# Patient Record
Sex: Male | Born: 1945 | Marital: Single | Smoking: Current every day smoker
Health system: Southern US, Community
[De-identification: ages and names within clinical notes are randomized; demographics above are authoritative.]

## PROBLEM LIST (undated history)

## (undated) DIAGNOSIS — I639 Cerebral infarction, unspecified: Secondary | ICD-10-CM

---

## 2009-11-22 ENCOUNTER — Inpatient Hospital Stay: Payer: Self-pay | Admitting: Internal Medicine

## 2012-10-30 IMAGING — CT CT HEAD WITHOUT CONTRAST
1 series · 16 of 30 positions shown, 20 images · non-contrast
Comparison: none

REASON FOR EXAM: syncope head injury
COMMENTS:

PROCEDURE:     CT  - CT HEAD WITHOUT CONTRAST  - November 22, 2009  [DATE]
RESULT:     Comparison:  None
TECHNIQUE: Multiple axial images from the foramen magnum to the vertex were
obtained without IV contrast.

[Series 2: soft tissue · axial · 0.46mm/px · z∈[+156,+296]mm · 16 of 32 slices shown, 20 images]
[im 2/32  brain]
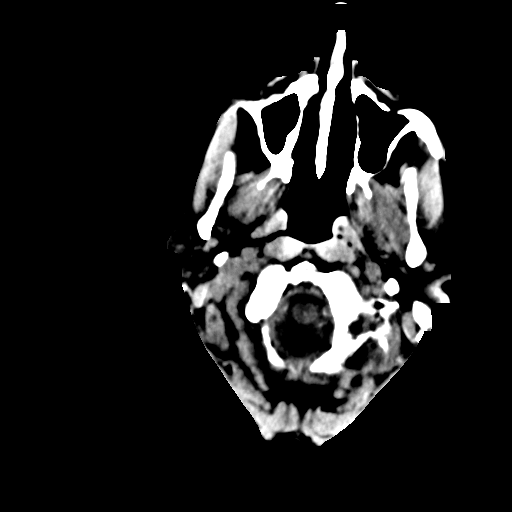
[im 2/32  bone]
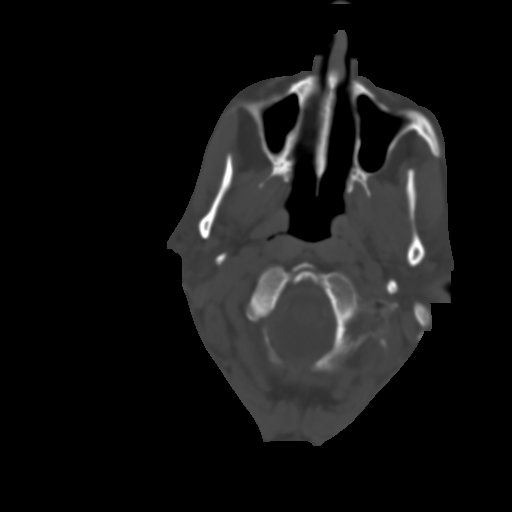
[im 4/32  brain]
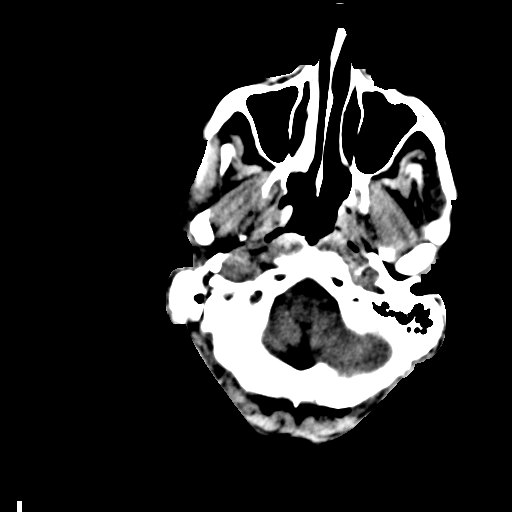
[im 6/32  brain]
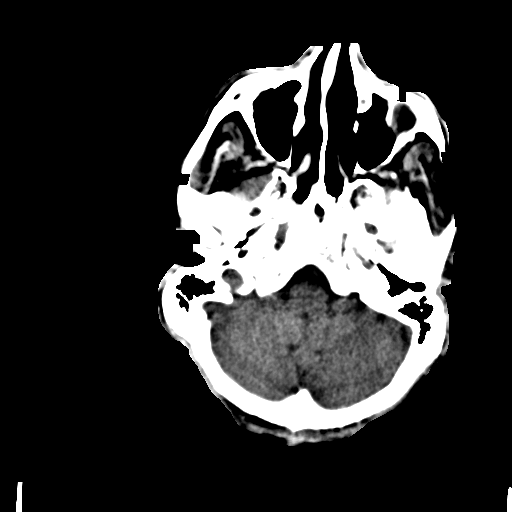
[im 8/32  brain]
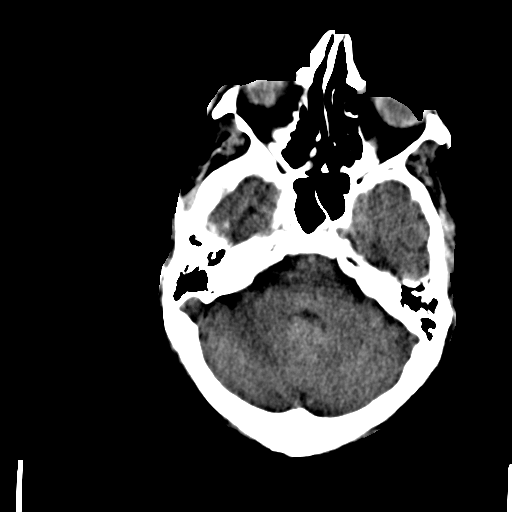
[im 9/32  brain]
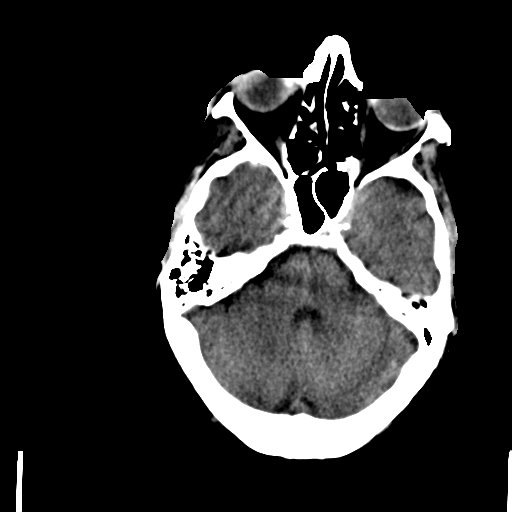
[im 9/32  bone]
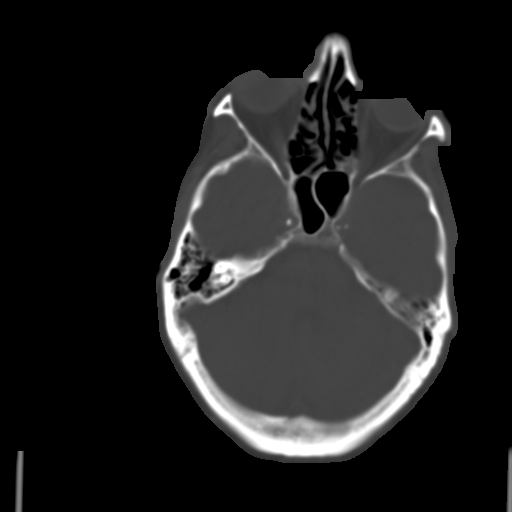
[im 11/32  brain]
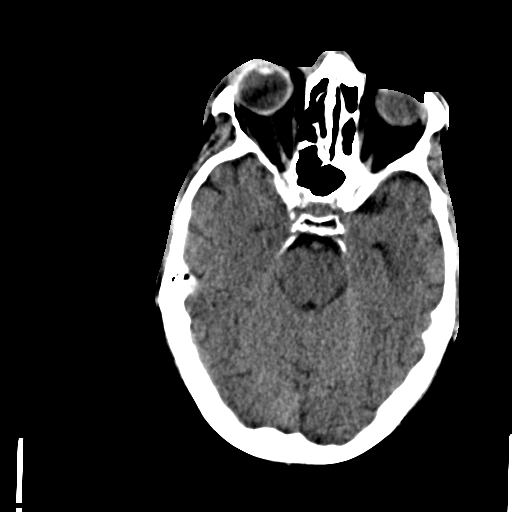
[im 13/32  brain]
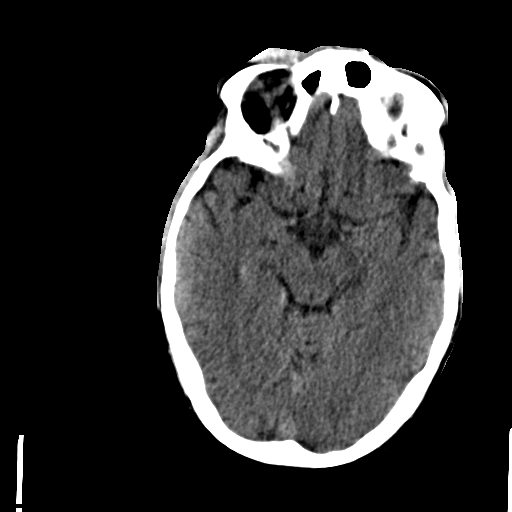
[im 15/32  brain]
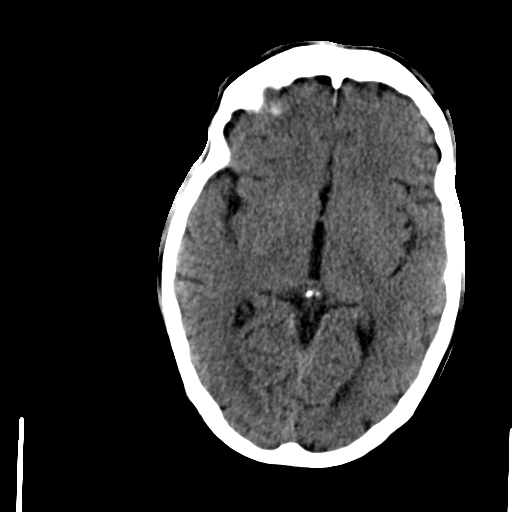
[im 17/32  brain]
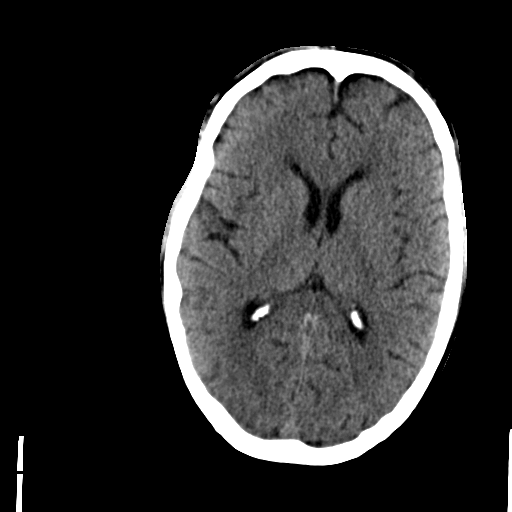
[im 17/32  bone]
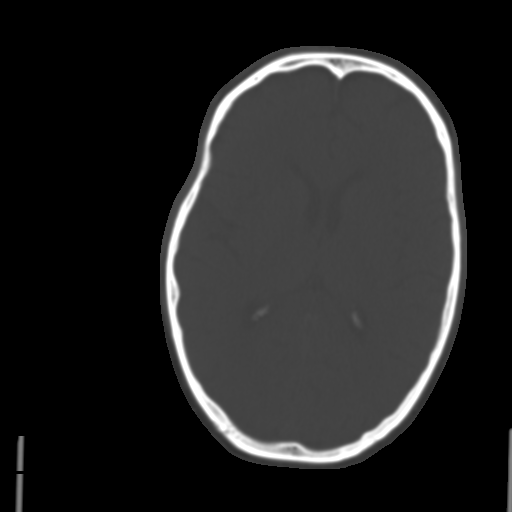
[im 19/32  brain]
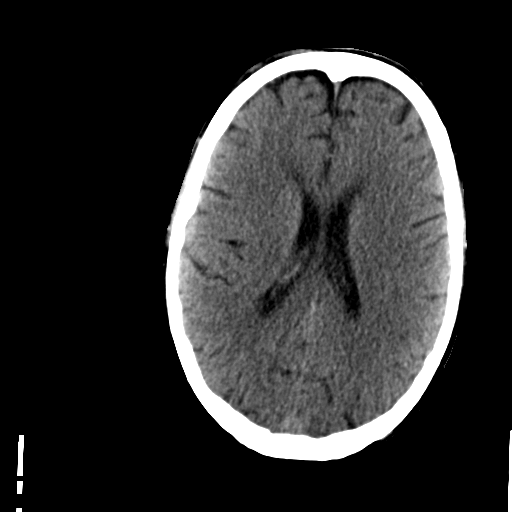
[im 21/32  brain]
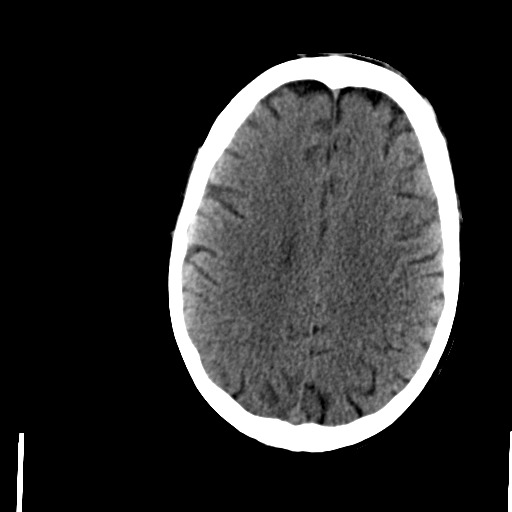
[im 23/32  brain]
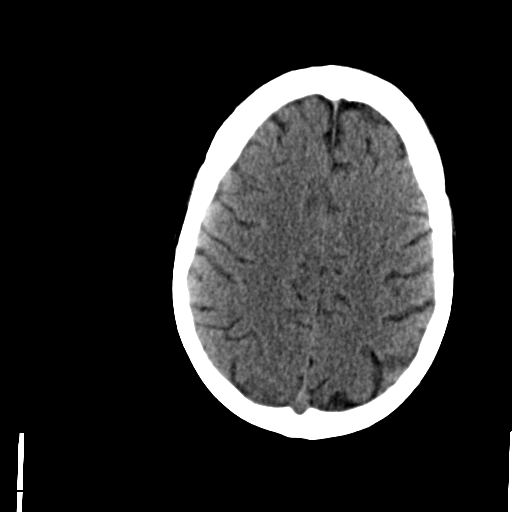
[im 24/32  brain]
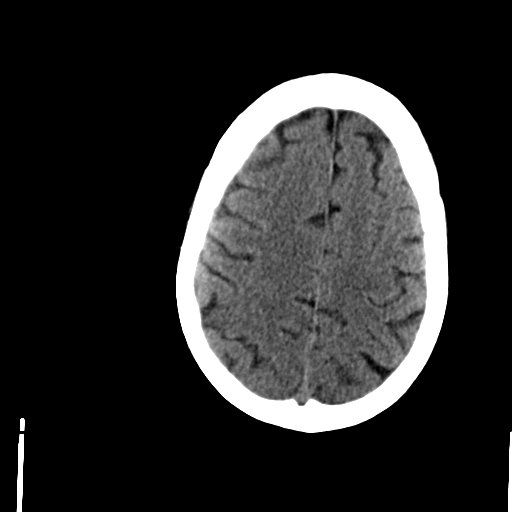
[im 24/32  bone]
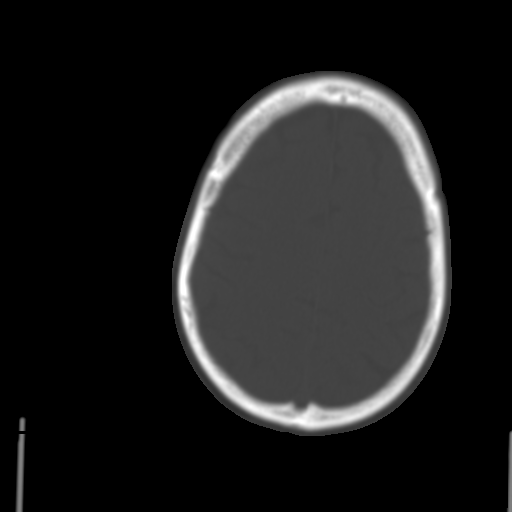
[im 26/32  brain]
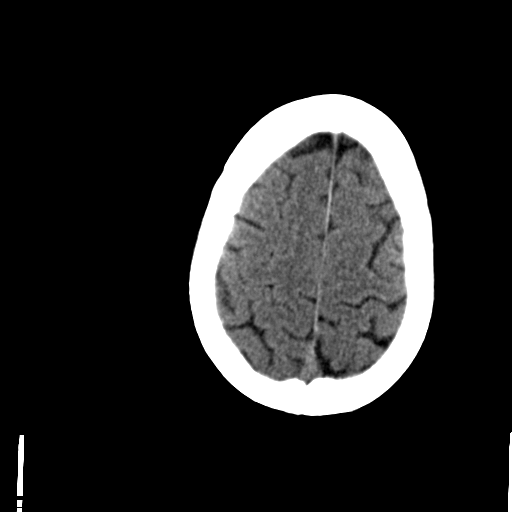
[im 28/32  brain]
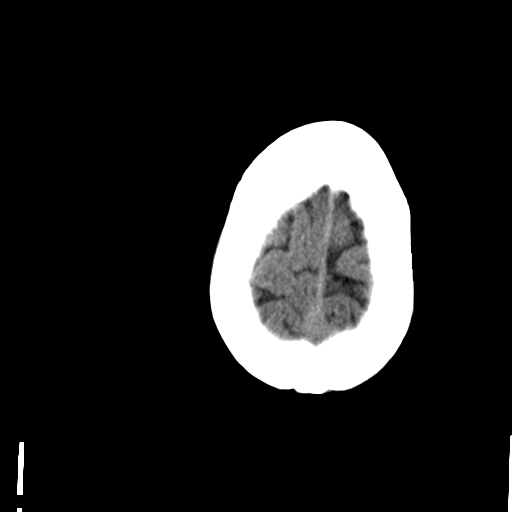
[im 30/32  brain]
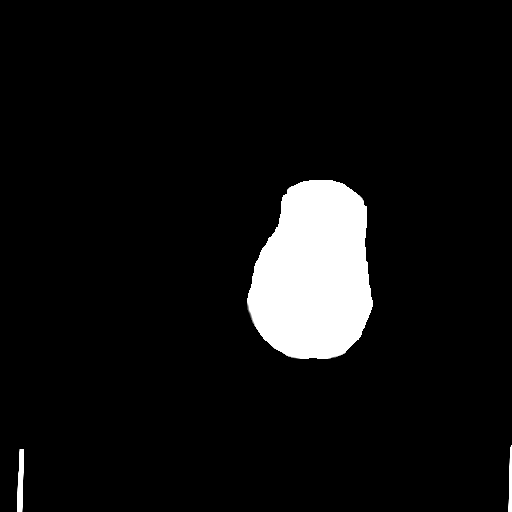

[16 of 30 positions shown; findings below may reference images not displayed]

FINDINGS: There is no evidence of mass effect, midline shift, or extra-axial fluid
collections.  There is no evidence of a space-occupying lesion or
intracranial hemorrhage. There is no evidence of a cortical-based area of
acute infarction. There is periventricular white matter low attenuation
likely secondary to microangiopathy.

The ventricles and sulci are appropriate for the patient's age. The basal
cisterns are patent.

Visualized portions of the orbits are unremarkable. The visualized portions
of the paranasal sinuses and mastoid air cells are unremarkable.

The osseous structures are unremarkable. There is a left frontal scalp
hematoma.
IMPRESSION: No acute intracranial process.

## 2020-04-14 ENCOUNTER — Inpatient Hospital Stay
Admission: EM | Admit: 2020-04-14 | Discharge: 2020-05-23 | DRG: 682 | Disposition: E | Payer: No Typology Code available for payment source | Attending: Internal Medicine | Admitting: Internal Medicine

## 2020-04-14 ENCOUNTER — Emergency Department: Payer: No Typology Code available for payment source

## 2020-04-14 ENCOUNTER — Other Ambulatory Visit: Payer: Self-pay

## 2020-04-14 DIAGNOSIS — Z978 Presence of other specified devices: Secondary | ICD-10-CM

## 2020-04-14 DIAGNOSIS — J440 Chronic obstructive pulmonary disease with acute lower respiratory infection: Secondary | ICD-10-CM | POA: Diagnosis present

## 2020-04-14 DIAGNOSIS — J441 Chronic obstructive pulmonary disease with (acute) exacerbation: Secondary | ICD-10-CM | POA: Diagnosis present

## 2020-04-14 DIAGNOSIS — R296 Repeated falls: Secondary | ICD-10-CM | POA: Diagnosis present

## 2020-04-14 DIAGNOSIS — E869 Volume depletion, unspecified: Secondary | ICD-10-CM | POA: Diagnosis present

## 2020-04-14 DIAGNOSIS — S0001XA Abrasion of scalp, initial encounter: Secondary | ICD-10-CM | POA: Diagnosis present

## 2020-04-14 DIAGNOSIS — R54 Age-related physical debility: Secondary | ICD-10-CM | POA: Diagnosis present

## 2020-04-14 DIAGNOSIS — J690 Pneumonitis due to inhalation of food and vomit: Secondary | ICD-10-CM | POA: Diagnosis not present

## 2020-04-14 DIAGNOSIS — F10231 Alcohol dependence with withdrawal delirium: Secondary | ICD-10-CM | POA: Diagnosis present

## 2020-04-14 DIAGNOSIS — Z5900 Homelessness unspecified: Secondary | ICD-10-CM

## 2020-04-14 DIAGNOSIS — Z01818 Encounter for other preprocedural examination: Secondary | ICD-10-CM

## 2020-04-14 DIAGNOSIS — W1830XA Fall on same level, unspecified, initial encounter: Secondary | ICD-10-CM | POA: Diagnosis present

## 2020-04-14 DIAGNOSIS — R6521 Severe sepsis with septic shock: Secondary | ICD-10-CM | POA: Diagnosis not present

## 2020-04-14 DIAGNOSIS — Z681 Body mass index (BMI) 19 or less, adult: Secondary | ICD-10-CM

## 2020-04-14 DIAGNOSIS — Y9259 Other trade areas as the place of occurrence of the external cause: Secondary | ICD-10-CM

## 2020-04-14 DIAGNOSIS — J80 Acute respiratory distress syndrome: Secondary | ICD-10-CM | POA: Diagnosis not present

## 2020-04-14 DIAGNOSIS — Z20822 Contact with and (suspected) exposure to covid-19: Secondary | ICD-10-CM | POA: Diagnosis present

## 2020-04-14 DIAGNOSIS — I428 Other cardiomyopathies: Secondary | ICD-10-CM | POA: Diagnosis not present

## 2020-04-14 DIAGNOSIS — G928 Other toxic encephalopathy: Secondary | ICD-10-CM | POA: Diagnosis present

## 2020-04-14 DIAGNOSIS — F1010 Alcohol abuse, uncomplicated: Secondary | ICD-10-CM

## 2020-04-14 DIAGNOSIS — S0081XA Abrasion of other part of head, initial encounter: Secondary | ICD-10-CM | POA: Diagnosis present

## 2020-04-14 DIAGNOSIS — D638 Anemia in other chronic diseases classified elsewhere: Secondary | ICD-10-CM | POA: Diagnosis present

## 2020-04-14 DIAGNOSIS — Z515 Encounter for palliative care: Secondary | ICD-10-CM | POA: Diagnosis not present

## 2020-04-14 DIAGNOSIS — Z66 Do not resuscitate: Secondary | ICD-10-CM | POA: Diagnosis not present

## 2020-04-14 DIAGNOSIS — E871 Hypo-osmolality and hyponatremia: Secondary | ICD-10-CM | POA: Diagnosis present

## 2020-04-14 DIAGNOSIS — Z4659 Encounter for fitting and adjustment of other gastrointestinal appliance and device: Secondary | ICD-10-CM

## 2020-04-14 DIAGNOSIS — I96 Gangrene, not elsewhere classified: Secondary | ICD-10-CM | POA: Diagnosis present

## 2020-04-14 DIAGNOSIS — I248 Other forms of acute ischemic heart disease: Secondary | ICD-10-CM | POA: Diagnosis present

## 2020-04-14 DIAGNOSIS — J189 Pneumonia, unspecified organism: Secondary | ICD-10-CM

## 2020-04-14 DIAGNOSIS — E43 Unspecified severe protein-calorie malnutrition: Secondary | ICD-10-CM | POA: Diagnosis present

## 2020-04-14 DIAGNOSIS — N170 Acute kidney failure with tubular necrosis: Secondary | ICD-10-CM | POA: Diagnosis present

## 2020-04-14 DIAGNOSIS — E875 Hyperkalemia: Secondary | ICD-10-CM | POA: Diagnosis present

## 2020-04-14 DIAGNOSIS — Z23 Encounter for immunization: Secondary | ICD-10-CM

## 2020-04-14 DIAGNOSIS — R198 Other specified symptoms and signs involving the digestive system and abdomen: Secondary | ICD-10-CM

## 2020-04-14 DIAGNOSIS — I255 Ischemic cardiomyopathy: Secondary | ICD-10-CM | POA: Diagnosis present

## 2020-04-14 DIAGNOSIS — A419 Sepsis, unspecified organism: Secondary | ICD-10-CM | POA: Diagnosis not present

## 2020-04-14 DIAGNOSIS — F17210 Nicotine dependence, cigarettes, uncomplicated: Secondary | ICD-10-CM | POA: Diagnosis present

## 2020-04-14 DIAGNOSIS — R4182 Altered mental status, unspecified: Secondary | ICD-10-CM | POA: Diagnosis present

## 2020-04-14 DIAGNOSIS — E872 Acidosis: Secondary | ICD-10-CM | POA: Diagnosis present

## 2020-04-14 DIAGNOSIS — J9602 Acute respiratory failure with hypercapnia: Secondary | ICD-10-CM | POA: Diagnosis not present

## 2020-04-14 DIAGNOSIS — J155 Pneumonia due to Escherichia coli: Secondary | ICD-10-CM | POA: Diagnosis not present

## 2020-04-14 DIAGNOSIS — Z8673 Personal history of transient ischemic attack (TIA), and cerebral infarction without residual deficits: Secondary | ICD-10-CM | POA: Diagnosis not present

## 2020-04-14 DIAGNOSIS — R410 Disorientation, unspecified: Secondary | ICD-10-CM

## 2020-04-14 DIAGNOSIS — Z781 Physical restraint status: Secondary | ICD-10-CM

## 2020-04-14 DIAGNOSIS — J9601 Acute respiratory failure with hypoxia: Secondary | ICD-10-CM | POA: Diagnosis not present

## 2020-04-14 HISTORY — DX: Cerebral infarction, unspecified: I63.9

## 2020-04-14 LAB — COMPREHENSIVE METABOLIC PANEL
ALT: 30 U/L (ref 0–44)
AST: 75 U/L — ABNORMAL HIGH (ref 15–41)
Albumin: 3.9 g/dL (ref 3.5–5.0)
Alkaline Phosphatase: 99 U/L (ref 38–126)
Anion gap: 18 — ABNORMAL HIGH (ref 5–15)
BUN: 28 mg/dL — ABNORMAL HIGH (ref 8–23)
CO2: 20 mmol/L — ABNORMAL LOW (ref 22–32)
Calcium: 8.9 mg/dL (ref 8.9–10.3)
Chloride: 72 mmol/L — ABNORMAL LOW (ref 98–111)
Creatinine, Ser: 1.65 mg/dL — ABNORMAL HIGH (ref 0.61–1.24)
GFR, Estimated: 44 mL/min — ABNORMAL LOW (ref >60)
Glucose, Bld: 102 mg/dL — ABNORMAL HIGH (ref 70–99)
Potassium: 4.6 mmol/L (ref 3.5–5.1)
Sodium: 110 mmol/L — CL (ref 135–145)
Total Bilirubin: 1.3 mg/dL — ABNORMAL HIGH (ref 0.3–1.2)
Total Protein: 7 g/dL (ref 6.5–8.1)

## 2020-04-14 LAB — OSMOLALITY, URINE: Osmolality, Ur: 193 mOsm/kg — ABNORMAL LOW (ref 300–900)

## 2020-04-14 LAB — URINALYSIS, COMPLETE (UACMP) WITH MICROSCOPIC
Bacteria, UA: NONE SEEN
Bilirubin Urine: NEGATIVE
Glucose, UA: NEGATIVE mg/dL
Ketones, ur: 5 mg/dL — AB
Leukocytes,Ua: NEGATIVE
Nitrite: NEGATIVE
Protein, ur: 30 mg/dL — AB
Specific Gravity, Urine: 1.006 (ref 1.005–1.030)
pH: 6 (ref 5.0–8.0)

## 2020-04-14 LAB — CBC WITH DIFFERENTIAL/PLATELET
Abs Immature Granulocytes: 0.1 10*3/uL — ABNORMAL HIGH (ref 0.00–0.07)
Basophils Absolute: 0 10*3/uL (ref 0.0–0.1)
Basophils Relative: 0 %
Eosinophils Absolute: 0 10*3/uL (ref 0.0–0.5)
Eosinophils Relative: 0 %
Hemoglobin: 13.5 g/dL (ref 13.0–17.0)
Immature Granulocytes: 1 %
Lymphocytes Relative: 7 %
Lymphs Abs: 0.7 10*3/uL (ref 0.7–4.0)
Monocytes Absolute: 0.9 10*3/uL (ref 0.1–1.0)
Monocytes Relative: 8 %
Neutro Abs: 9.4 10*3/uL — ABNORMAL HIGH (ref 1.7–7.7)
Neutrophils Relative %: 84 %
Platelets: 230 10*3/uL (ref 150–400)
Smear Review: NORMAL
WBC: 11.2 10*3/uL — ABNORMAL HIGH (ref 4.0–10.5)
nRBC: 0 % (ref 0.0–0.2)

## 2020-04-14 LAB — URINE DRUG SCREEN, QUALITATIVE (ARMC ONLY)
Amphetamines, Ur Screen: NOT DETECTED
Barbiturates, Ur Screen: NOT DETECTED
Benzodiazepine, Ur Scrn: NOT DETECTED
Cannabinoid 50 Ng, Ur ~~LOC~~: NOT DETECTED
Cocaine Metabolite,Ur ~~LOC~~: NOT DETECTED
MDMA (Ecstasy)Ur Screen: NOT DETECTED
Methadone Scn, Ur: NOT DETECTED
Opiate, Ur Screen: NOT DETECTED
Phencyclidine (PCP) Ur S: NOT DETECTED
Tricyclic, Ur Screen: NOT DETECTED

## 2020-04-14 LAB — RESP PANEL BY RT-PCR (FLU A&B, COVID) ARPGX2
Influenza A by PCR: NEGATIVE
Influenza B by PCR: NEGATIVE
SARS Coronavirus 2 by RT PCR: NEGATIVE

## 2020-04-14 LAB — ETHANOL: Alcohol, Ethyl (B): <10 mg/dL (ref <10)

## 2020-04-14 LAB — SODIUM
Sodium: 111 mmol/L — CL (ref 135–145)
Sodium: 112 mmol/L — CL (ref 135–145)

## 2020-04-14 LAB — TROPONIN I (HIGH SENSITIVITY)
Troponin I (High Sensitivity): 24 ng/L — ABNORMAL HIGH (ref <18)
Troponin I (High Sensitivity): 21 ng/L — ABNORMAL HIGH (ref <18)

## 2020-04-14 LAB — OSMOLALITY: Osmolality: 239 mOsm/kg — CL (ref 275–295)

## 2020-04-14 LAB — GLUCOSE, CAPILLARY: Glucose-Capillary: 84 mg/dL (ref 70–99)

## 2020-04-14 LAB — MAGNESIUM: Magnesium: 2.1 mg/dL (ref 1.7–2.4)

## 2020-04-14 LAB — SODIUM, URINE, RANDOM: Sodium, Ur: <10 mmol/L

## 2020-04-14 LAB — PHOSPHORUS: Phosphorus: 3.9 mg/dL (ref 2.5–4.6)

## 2020-04-14 MED ORDER — SODIUM CHLORIDE 3 % IV SOLN
INTRAVENOUS | Status: DC
  Filled 2020-04-14 (×3): qty 500

## 2020-04-14 MED ORDER — ACETAMINOPHEN 325 MG PO TABS
650.0000 mg | ORAL_TABLET | ORAL | Status: DC | PRN
  Administered 2020-04-14: 650 mg via ORAL
  Filled 2020-04-14: qty 2

## 2020-04-14 MED ORDER — THIAMINE HCL 100 MG PO TABS: 100.0000 mg | ORAL_TABLET | Freq: Every day | ORAL | Status: DC

## 2020-04-14 MED ORDER — LORAZEPAM 1 MG PO TABS
1.0000 mg | ORAL_TABLET | ORAL | Status: DC | PRN
  Administered 2020-04-15: 2 mg via ORAL
  Filled 2020-04-14: qty 2

## 2020-04-14 MED ORDER — LACTATED RINGERS IV BOLUS
1000.0000 mL | Freq: Once | INTRAVENOUS | Status: AC
  Administered 2020-04-14: 1000 mL via INTRAVENOUS

## 2020-04-14 MED ORDER — LORAZEPAM 2 MG/ML IJ SOLN
1.0000 mg | INTRAMUSCULAR | Status: DC | PRN
  Administered 2020-04-15 (×2): 2 mg via INTRAVENOUS
  Filled 2020-04-14 (×2): qty 1

## 2020-04-14 MED ORDER — THIAMINE HCL 100 MG/ML IJ SOLN
500.0000 mg | INTRAVENOUS | Status: AC
  Administered 2020-04-15 – 2020-04-17 (×3): 500 mg via INTRAVENOUS
  Filled 2020-04-14 (×3): qty 5

## 2020-04-14 MED ORDER — ONDANSETRON HCL 4 MG/2ML IJ SOLN: 4.0000 mg | Freq: Four times a day (QID) | INTRAMUSCULAR | Status: DC | PRN

## 2020-04-14 MED ORDER — THIAMINE HCL 100 MG/ML IJ SOLN
500.0000 mg | Freq: Once | INTRAMUSCULAR | Status: AC
  Administered 2020-04-14: 500 mg via INTRAVENOUS
  Filled 2020-04-14: qty 6

## 2020-04-14 MED ORDER — FOLIC ACID 1 MG PO TABS: 1.0000 mg | ORAL_TABLET | Freq: Every day | ORAL | Status: DC

## 2020-04-14 MED ORDER — ADULT MULTIVITAMIN W/MINERALS CH: 1.0000 | ORAL_TABLET | Freq: Every day | ORAL | Status: DC

## 2020-04-14 MED ORDER — THIAMINE HCL 100 MG/ML IJ SOLN: 100.0000 mg | Freq: Every day | INTRAMUSCULAR | Status: DC

## 2020-04-14 MED ORDER — ENOXAPARIN SODIUM 40 MG/0.4ML ~~LOC~~ SOLN
40.0000 mg | SUBCUTANEOUS | Status: DC
  Administered 2020-04-15: 40 mg via SUBCUTANEOUS
  Filled 2020-04-14: qty 0.4

## 2020-04-14 MED ORDER — POLYETHYLENE GLYCOL 3350 17 G PO PACK: 17.0000 g | PACK | Freq: Every day | ORAL | Status: DC | PRN

## 2020-04-14 MED ORDER — DOCUSATE SODIUM 100 MG PO CAPS: 100.0000 mg | ORAL_CAPSULE | Freq: Two times a day (BID) | ORAL | Status: DC | PRN

## 2020-04-14 NOTE — Progress Notes (Signed)
eLink Physician-Brief Progress Note Patient Name: Adam Arnold DOB: 1945-04-16 MRN: [id number]   Date of Service  April 18, 2020  HPI/Events of Note  Patient admitted to the ICU with severe hyponatremia, and altered mental status, he has a history of heavy ETOH abuse, patient had to be IVC'd due to reluctance to come to the hospital and concern that he is no longer able to safely take care of himself, there is no suggestion of suicidal or homicidal ideation.  eICU Interventions  New Patient Evaluation completed.        Migdalia Dk 2020/04/18, 11:48 PM

## 2020-04-14 NOTE — ED Notes (Signed)
Patient transported to CT 

## 2020-04-14 NOTE — H&P (Addendum)
NAMEDamieon Arnold, MRN:  [id number], DOB:  11-Feb-1945, LOS: 0 ADMISSION DATE:  04-27-2020, CONSULTATION DATE: 27-Apr-2020 REFERRING MD: Dr. Larinda Buttery, CHIEF COMPLAINT: Falls  History of Present Illness:  This is a 75 yo male with a hx of current ETOH Abuse drinks unknown amount of alcohol daily.  He arrived to [facility] ER on 03/23 via EMS from a local motel where he has been residing over the past 2 weeks following multiple falls, and was  subsequently found crawling on the ground by the Armed forces technical officer.  Upon EMS arrival pt noted to have multiple empty beer bottles around his room, although he reported he did not have a drink today.  Pt initially refused transport to the ER for treatment, therefore IVC paperwork completed due to concern of pts inability to care for himself although pt not suicidal or homicidal.    ED course Upon arrival to the ER pt reported he had a stroke 3 yrs ago resulting in frequent falls along with "some intermittent trouble" with his right side.  Lab results revealed Na+ 110, chloride 72, CO2 20, glucose 102, BUN 28, creatinine 1.65, anion gap 18, troponin 24, wbc 11.2, and alcohol level <10.  CXR negative for active cardiopulmonary disease, but concerning for artifact vs. nondisplaced fracture of the anterior left fifth rib.  Due to severe hyponatremia ER physician consulted on call Nephrologist Dr. Cherylann Ratel who recommended placing pt on hypertonic 3% saline infusion.  PCCM contacted for ICU admission.    Pertinent  Medical History  Stroke Current ETOH Abuse  Tobacco Abuse   Significant Hospital Events: Including procedures, antibiotic start and stop dates in addition to other pertinent events   03/23: Pt admitted to ICU with severe hyponatremia requiring 3% hypertonic saline infusion   Interim History / Subjective:  Pt denies suicidal or homicidal ideation  Objective   Blood pressure 122/79, pulse (!) 109, temperature 97.6 F (36.4 C), temperature source Oral, resp. rate 19,  height 5\' 11"  (1.803 m), weight 59.9 kg, SpO2 100 %.       No intake or output data in the 24 hours ending 2020/04/27 2215 Filed Weights   04/27/2020 2038  Weight: 59.9 kg    Examination: General: acutely ill disheveled frail male, resting in bed NAD HENT: supple, no JVD  Lungs: clear throughout, even, non labored  Cardiovascular: nsr, rrr, no R/G, 2+ radial/2+ distal pulses  Abdomen: +BS x4, soft, non tender, non distended  Extremities: normal bulk and tone, moves all extremities  Skin: forehead abrasion, posterior scalp abrasion  Neuro: alert and oriented, follows commands, PERRLA GU: voiding via urinal   Labs/imaging that I havepersonally reviewed   03/23: Na+ 110, Chloride 72, CO2 20, glucose 102, BUN 28, creatinine 1.65, anion gap 18, troponin 24, wbc 11.2, alcohol level <10, and EKG normal sinus rhythm no signs of ischemia  03/23: CXR negative for active cardiopulmonary disease, but concerning for artifact vs. nondisplaced fracture of the anterior left fifth rib 03/23: CT Head Cervical Spine revealed no acute cervical spine fracture. Multilevel spondylosis and facet hypertrophy greatest from C4-5 through C6-7.  Resolved Hospital Problem list   N/A  Assessment & Plan:   Severe hyponatremia likely secondary to beer potomania  Continuous telemetry monitoring  Nephrology consulted appreciate input-continue 3% hypertonic saline per recommendations  Continue serial sodiums  Random urine sodium, UA, serum osmolality, and urine osmolality results pending  Mildly elevated troponin likely secondary to demand ischemia in the setting of severe hyponatremia  Trend troponin's  Acute renal failure with anion gap metabolic acidosis secondary to ATN Hypochloridemia  Trend BMP  Replace electrolytes as indicated  Monitor UOP  Avoid nephrotoxic medications   Mild leukocytosis with no s/sx of infection  Trend WBC and monitor fever curve  No indication for abx at this time  Protein  malnutrition Will consult dietitian appreciate input   Acute metabolic encephalopathy-CT Head negative for acute intracranial process   Frequent falls Hx: Current everyday ETOH abuse and tobacco abuse  Continue CIWA protocol  Will continue high dose thiamine  Urine drug screen pending ETOH abuse and smoking cessation counseling provided Maintain fall precautions  Will consult transition care team; pt also homeless Pt involuntarily committed will consult psychiatry   Best practice (evaluated daily)  Diet:  Oral Pain/Anxiety/Delirium protocol (if indicated): No VAP protocol (if indicated): Not indicated DVT prophylaxis: LMWH GI prophylaxis: N/A Glucose control:  SSI No Central venous access:  N/A Arterial line:  N/A Foley:  N/A Mobility:  bed rest  PT consulted: N/A Last date of multidisciplinary goals of care discussion [N/A] Code Status:  full code Disposition: ICU  Labs   CBC: Recent Labs  Lab 05/07/20 2039  WBC 11.2*  NEUTROABS 9.4*  HGB 13.5  HCT RESULTS UNAVAILABLE DUE TO INTERFERING SUBSTANCE  MCV RESULTS UNAVAILABLE DUE TO INTERFERING SUBSTANCE  PLT 230    Basic Metabolic Panel: Recent Labs  Lab May 07, 2020 2039  NA 110*  K 4.6  CL 72*  CO2 20*  GLUCOSE 102*  BUN 28*  CREATININE 1.65*  CALCIUM 8.9   GFR: Estimated Creatinine Clearance: 33.8 mL/min (A) (by C-G formula based on SCr of 1.65 mg/dL (H)). Recent Labs  Lab 05/07/20 2039  WBC 11.2*    Liver Function Tests: Recent Labs  Lab 2020-05-07 2039  AST 75*  ALT 30  ALKPHOS 99  BILITOT 1.3*  PROT 7.0  ALBUMIN 3.9   No results for input(s): LIPASE, AMYLASE in the last 168 hours. No results for input(s): AMMONIA in the last 168 hours.  ABG No results found for: PHART, PCO2ART, PO2ART, HCO3, TCO2, ACIDBASEDEF, O2SAT   Coagulation Profile: No results for input(s): INR, PROTIME in the last 168 hours.  Cardiac Enzymes: No results for input(s): CKTOTAL, CKMB, CKMBINDEX, TROPONINI in the  last 168 hours.  HbA1C: No results found for: HGBA1C  CBG: No results for input(s): GLUCAP in the last 168 hours.  Review of Systems: Positives in BOLD   Gen: falls, fever, chills, weight change, fatigue, night sweats HEENT: Denies blurred vision, double vision, hearing loss, tinnitus, sinus congestion, rhinorrhea, sore throat, neck stiffness, dysphagia PULM: Denies shortness of breath, cough, sputum production, hemoptysis, wheezing CV: Denies chest pain, edema, orthopnea, paroxysmal nocturnal dyspnea, palpitations GI: Denies abdominal pain, nausea, vomiting, diarrhea, hematochezia, melena, constipation, change in bowel habits GU: Denies dysuria, hematuria, polyuria, oliguria, urethral discharge Endocrine: Denies hot or cold intolerance, polyuria, polyphagia or appetite change Derm: Denies rash, dry skin, scaling or peeling skin change Heme: Denies easy bruising, bleeding, bleeding gums Neuro: Denies headache, numbness, weakness, slurred speech, loss of memory or consciousness  Past Medical History:  He,  has a past medical history of Stroke (HCC).   Surgical History:   Past Surgical History:  Procedure Laterality Date  . HERNIA REPAIR       Social History:   reports that he has been smoking cigarettes. He has been smoking about 1.00 pack per day. He has never used smokeless tobacco. He reports current alcohol use. He reports previous  drug use.   Family History:  His family history is not on file.   Allergies No Known Allergies   Home Medications  Prior to Admission medications   Not on File     Critical care time: 45 minutes      Sonda Rumble, [facility]  Pulmonary/Critical Care Pager [phone] (please enter 7 digits) PCCM Consult Pager [phone] (please enter 7 digits)

## 2020-04-14 NOTE — ED Provider Notes (Signed)
[facility] Emergency Department Provider Note   ____________________________________________   Event Date/Time   First MD Initiated Contact with Patient 04/10/2020 2038     (approximate)  I have reviewed the triage vital signs and the nursing notes.   HISTORY  Chief Complaint Fall    HPI Adam Arnold is a 75 y.o. male with past medical history of stroke who presents to the ED complaining of weakness.  EMS reports that the manager from a local motel called because the patient was found to have fallen multiple times, was subsequently crawling on the ground.  Patient states that he had a stroke on his right side about 3 years ago and was treated at the [place].  He states he has had trouble with his right side since then intermittently, but states he has been more dizzy than usual recently with difficulty walking.  Per EMS, patient found with multiple empty cases of beer around his room.  He states that he has not had anything to drink today but that he usually drinks on a daily basis, is unable to quantify how much.  He states his right arm and right leg are hurting him, denies any headache or neck pain.  He does not take any blood thinners.        Past Medical History:  Diagnosis Date  . Stroke [facility])     There are no problems to display for this patient.   Past Surgical History:  Procedure Laterality Date  . HERNIA REPAIR      Prior to Admission medications   Not on File    Allergies Patient has no known allergies.  History reviewed. No pertinent family history.  Social History Social History   Tobacco Use  . Smoking status: Current Every Day Smoker    Packs/day: 1.00    Types: Cigarettes  . Smokeless tobacco: Never Used  Substance Use Topics  . Alcohol use: Yes    Comment: 5-6 beers per day  . Drug use: Not Currently    Review of Systems  Constitutional: No fever/chills.  Positive for multiple falls. Eyes: No visual changes. ENT: No  sore throat. Cardiovascular: Denies chest pain. Respiratory: Denies shortness of breath. Gastrointestinal: No abdominal pain.  No nausea, no vomiting.  No diarrhea.  No constipation. Genitourinary: Negative for dysuria. Musculoskeletal: Negative for back pain. Skin: Negative for rash. Neurological: Negative for headaches, positive for right-sided pain and weakness.  ____________________________________________   PHYSICAL EXAM:  VITAL SIGNS: ED Triage Vitals [04/05/2020 2033]  Enc Vitals Group     BP (!) 98/41     Pulse Rate 88     Resp (!) 21     Temp 97.6 F (36.4 C)     Temp Source Oral     SpO2 99 %     Weight      Height      Head Circumference      Peak Flow      Pain Score      Pain Loc      Pain Edu?      Excl. in GC?     Constitutional: Alert and oriented to person, place, and time. Eyes: Conjunctivae are normal.  Pupils equal round and reactive to light bilaterally, extraocular movements intact. Head: Multiple abrasions over frontal scalp, no scalp hematomas or step-offs. Nose: No congestion/rhinnorhea. Mouth/Throat: Mucous membranes are moist. Neck: Normal ROM, no midline cervical spine tenderness. Cardiovascular: Normal rate, regular rhythm. Grossly normal heart sounds. Respiratory:  Normal respiratory effort.  No retractions. Lungs CTAB. Gastrointestinal: Soft and nontender. No distention. Genitourinary: deferred Musculoskeletal: No lower extremity tenderness nor edema. Neurologic:  Normal speech and language. No gross focal neurologic deficits are appreciated. Skin:  Skin is warm, dry and intact. No rash noted. Psychiatric: Mood and affect are normal. Speech and behavior are normal.  ____________________________________________   LABS (all labs ordered are listed, but only abnormal results are displayed)  Labs Reviewed  CBC WITH DIFFERENTIAL/PLATELET - Abnormal; Notable for the following components:      Result Value   WBC 11.2 (*)    Neutro Abs 9.4  (*)    Abs Immature Granulocytes 0.10 (*)    All other components within normal limits  COMPREHENSIVE METABOLIC PANEL - Abnormal; Notable for the following components:   Sodium 110 (*)    Chloride 72 (*)    CO2 20 (*)    Glucose, Bld 102 (*)    BUN 28 (*)    Creatinine, Ser 1.65 (*)    AST 75 (*)    Total Bilirubin 1.3 (*)    GFR, Estimated 44 (*)    Anion gap 18 (*)    All other components within normal limits  TROPONIN I (HIGH SENSITIVITY) - Abnormal; Notable for the following components:   Troponin I (High Sensitivity) 24 (*)    All other components within normal limits  RESP PANEL BY RT-PCR (FLU A&B, COVID) ARPGX2  ETHANOL  URINALYSIS, COMPLETE (UACMP) WITH MICROSCOPIC  URINE DRUG SCREEN, QUALITATIVE ([facility] ONLY)  SODIUM  SODIUM  SODIUM  OSMOLALITY  OSMOLALITY, URINE  SODIUM, URINE, RANDOM   ____________________________________________  EKG  ED ECG REPORT I, Chesley Noon, the attending physician, personally viewed and interpreted this ECG.   Date: 04/20/2020  EKG Time: 20:53  Rate: 80  Rhythm: normal sinus rhythm  Axis: Normal  Intervals:none  ST&T Change: None   PROCEDURES  Procedure(s) performed (including Critical Care):  .Critical Care Performed by: Chesley Noon, MD Authorized by: Chesley Noon, MD   Critical care provider statement:    Critical care time (minutes):  45   Critical care time was exclusive of:  Separately billable procedures and treating other patients and teaching time   Critical care was necessary to treat or prevent imminent or life-threatening deterioration of the following conditions:  Metabolic crisis   Critical care was time spent personally by me on the following activities:  Discussions with consultants, evaluation of patient's response to treatment, examination of patient, ordering and performing treatments and interventions, ordering and review of laboratory studies, ordering and review of radiographic studies, pulse  oximetry, re-evaluation of patient's condition, obtaining history from patient or surrogate and review of old charts   I assumed direction of critical care for this patient from another provider in my specialty: no     Care discussed with: admitting provider       ____________________________________________   INITIAL IMPRESSION / ASSESSMENT AND PLAN / ED COURSE       75 year old male with past medical history of stroke who presents to the ED complaining of increased weakness with multiple falls recently.  Patient is alert and oriented here in the ED with no focal deficits, but per EMS was unable to walk at all on scene.  He admits to regular alcohol consumption recently, was found surrounded by empty cases of beer, but no apparent symptoms of withdrawal at this moment.  Given his multiple falls, we will check CT head and C-spine.  I would  consider stroke, electrolyte abnormality, Warnicke's encephalopathy, alcohol intoxication.  We will hydrate with IV fluids and give dose of IV thiamine, labs are pending.  Chest x-ray reviewed by me and shows no infiltrate, edema, or effusion.  CT head and C-spine are negative for acute process.  Labs remarkable for severe hyponatremia with sodium of 110, likely contributing to patient's multiple falls.  Case discussed with Dr. Cherylann Ratel of nephrology, who recommends starting hypertonic saline at 30 cc/h.  Case discussed with ICU provider for admission.      ____________________________________________   FINAL CLINICAL IMPRESSION(S) / ED DIAGNOSES  Final diagnoses:  Hyponatremia  Multiple falls     ED Discharge Orders    None       Note:  This document was prepared using Dragon voice recognition software and may include unintentional dictation errors.   Chesley Noon, MD 03/26/2020 [phone]

## 2020-04-14 NOTE — ED Notes (Signed)
Dr Larinda Buttery to bedside to eval pt wound on R great toe. Partial thickness avulsion with missing toe nail noted.  No drainage. Skin on bilateral toes appears boggy.  Abrasions to the BLE.     Nonadherent dressing applied to great toe.

## 2020-04-14 NOTE — ED Triage Notes (Signed)
Pt presents via EMS from [facility] for c/o fall today. Per report manager from hotel called EMS due to pt falling.  Pt sts he falls since having a stroke 3 years ago. No pronator drift, RLE pain, face symmetrical at this time. Pt c/o R arm pain. Pt noted to have multiple superficial abrasions to forehead and face.  Pt is disheveled in appearance, strong odor of urine noted.   Per report, pt has been at hotel x 2 weeks and manager called due to concern for frequent falls and pt unable to care for himself.

## 2020-04-14 NOTE — ED Notes (Signed)
IVC paperwork brought by police due to concern for pt safety and inability to care for himself. Pt is not SI/HI or flight risk. Charge RN Raquel notified of IVC status. 15 min rounds being done on patient by [facility] tech.  Belongings including soiled clothing and wallet remain in room with patient.   Warm blankets applied for comfort.

## 2020-04-15 ENCOUNTER — Inpatient Hospital Stay: Payer: No Typology Code available for payment source

## 2020-04-15 ENCOUNTER — Inpatient Hospital Stay: Payer: Self-pay

## 2020-04-15 DIAGNOSIS — J9601 Acute respiratory failure with hypoxia: Secondary | ICD-10-CM

## 2020-04-15 DIAGNOSIS — R410 Disorientation, unspecified: Secondary | ICD-10-CM

## 2020-04-15 DIAGNOSIS — F1010 Alcohol abuse, uncomplicated: Secondary | ICD-10-CM

## 2020-04-15 DIAGNOSIS — F10231 Alcohol dependence with withdrawal delirium: Secondary | ICD-10-CM

## 2020-04-15 DIAGNOSIS — E871 Hypo-osmolality and hyponatremia: Secondary | ICD-10-CM

## 2020-04-15 LAB — GLUCOSE, CAPILLARY
Glucose-Capillary: 113 mg/dL — ABNORMAL HIGH (ref 70–99)
Glucose-Capillary: 145 mg/dL — ABNORMAL HIGH (ref 70–99)
Glucose-Capillary: 185 mg/dL — ABNORMAL HIGH (ref 70–99)
Glucose-Capillary: 193 mg/dL — ABNORMAL HIGH (ref 70–99)
Glucose-Capillary: 193 mg/dL — ABNORMAL HIGH (ref 70–99)
Glucose-Capillary: 193 mg/dL — ABNORMAL HIGH (ref 70–99)
Glucose-Capillary: 193 mg/dL — ABNORMAL HIGH (ref 70–99)
Glucose-Capillary: 30 mg/dL — CL (ref 70–99)
Glucose-Capillary: 55 mg/dL — ABNORMAL LOW (ref 70–99)
Glucose-Capillary: 68 mg/dL — ABNORMAL LOW (ref 70–99)
Glucose-Capillary: 71 mg/dL (ref 70–99)
Glucose-Capillary: 94 mg/dL (ref 70–99)
Glucose-Capillary: 95 mg/dL (ref 70–99)

## 2020-04-15 LAB — COMPREHENSIVE METABOLIC PANEL
ALT: 29 U/L (ref 0–44)
AST: 84 U/L — ABNORMAL HIGH (ref 15–41)
Albumin: 3.9 g/dL (ref 3.5–5.0)
Alkaline Phosphatase: 100 U/L (ref 38–126)
Anion gap: 14 (ref 5–15)
BUN: 32 mg/dL — ABNORMAL HIGH (ref 8–23)
CO2: 24 mmol/L (ref 22–32)
Calcium: 9.1 mg/dL (ref 8.9–10.3)
Chloride: 77 mmol/L — ABNORMAL LOW (ref 98–111)
Creatinine, Ser: 1.75 mg/dL — ABNORMAL HIGH (ref 0.61–1.24)
GFR, Estimated: 41 mL/min — ABNORMAL LOW (ref >60)
Glucose, Bld: 80 mg/dL (ref 70–99)
Potassium: 5.6 mmol/L — ABNORMAL HIGH (ref 3.5–5.1)
Sodium: 115 mmol/L — CL (ref 135–145)
Total Bilirubin: 1.2 mg/dL (ref 0.3–1.2)
Total Protein: 6.8 g/dL (ref 6.5–8.1)

## 2020-04-15 LAB — SODIUM
Sodium: 117 mmol/L — CL (ref 135–145)
Sodium: 120 mmol/L — ABNORMAL LOW (ref 135–145)

## 2020-04-15 LAB — BLOOD GAS, ARTERIAL
Acid-base deficit: 4 mmol/L — ABNORMAL HIGH (ref 0.0–2.0)
Bicarbonate: 23 mmol/L (ref 20.0–28.0)
FIO2: 1
MECHVT: 500 mL
Mechanical Rate: 16
O2 Saturation: 99.8 %
PEEP: 5 cmH2O
Patient temperature: 37
RATE: 16 resp/min
pCO2 arterial: 49 mmHg — ABNORMAL HIGH (ref 32.0–48.0)
pH, Arterial: 7.28 — ABNORMAL LOW (ref 7.350–7.450)
pO2, Arterial: 250 mmHg — ABNORMAL HIGH (ref 83.0–108.0)

## 2020-04-15 LAB — URIC ACID: Uric Acid, Serum: 9 mg/dL — ABNORMAL HIGH (ref 3.7–8.6)

## 2020-04-15 LAB — CORTISOL: Cortisol, Plasma: 26.7 ug/dL

## 2020-04-15 LAB — LACTIC ACID, PLASMA
Lactic Acid, Venous: 1.8 mmol/L (ref 0.5–1.9)
Lactic Acid, Venous: 0.9 mmol/L (ref 0.5–1.9)

## 2020-04-15 LAB — POTASSIUM: Potassium: 3.5 mmol/L (ref 3.5–5.1)

## 2020-04-15 LAB — CBC
Hemoglobin: 13.5 g/dL (ref 13.0–17.0)
Platelets: 218 10*3/uL (ref 150–400)
WBC: 11 10*3/uL — ABNORMAL HIGH (ref 4.0–10.5)
nRBC: 0 % (ref 0.0–0.2)

## 2020-04-15 LAB — OSMOLALITY, URINE: Osmolality, Ur: 336 mOsm/kg (ref 300–900)

## 2020-04-15 LAB — TSH: TSH: 0.919 u[IU]/mL (ref 0.350–4.500)

## 2020-04-15 LAB — SODIUM, URINE, RANDOM: Sodium, Ur: <10 mmol/L

## 2020-04-15 LAB — MRSA PCR SCREENING: MRSA by PCR: POSITIVE — AB

## 2020-04-15 LAB — PHOSPHORUS: Phosphorus: 4.6 mg/dL (ref 2.5–4.6)

## 2020-04-15 LAB — OSMOLALITY: Osmolality: 248 mOsm/kg — CL (ref 275–295)

## 2020-04-15 LAB — MAGNESIUM: Magnesium: 2.1 mg/dL (ref 1.7–2.4)

## 2020-04-15 MED ORDER — OXYCODONE-ACETAMINOPHEN 5-325 MG PO TABS: 1.0000 | ORAL_TABLET | Freq: Four times a day (QID) | ORAL | Status: DC | PRN

## 2020-04-15 MED ORDER — ALBUMIN HUMAN 5 % IV SOLN
25.0000 g | INTRAVENOUS | Status: AC
  Administered 2020-04-15: 25 g via INTRAVENOUS
  Filled 2020-04-15: qty 500

## 2020-04-15 MED ORDER — FENTANYL CITRATE (PF) 100 MCG/2ML IJ SOLN
INTRAMUSCULAR | Status: AC
  Administered 2020-04-15: 100 ug via INTRAVENOUS
  Filled 2020-04-15: qty 2

## 2020-04-15 MED ORDER — INSULIN ASPART 100 UNIT/ML IV SOLN
10.0000 [IU] | Freq: Once | INTRAVENOUS | Status: AC
  Administered 2020-04-15: 10 [IU] via INTRAVENOUS
  Filled 2020-04-15: qty 0.1

## 2020-04-15 MED ORDER — DEXTROSE 50 % IV SOLN
INTRAVENOUS | Status: AC
  Administered 2020-04-15: 50 mL
  Filled 2020-04-15: qty 50

## 2020-04-15 MED ORDER — SODIUM CHLORIDE 4 MEQ/ML IV SOLN
INTRAVENOUS | Status: DC
  Filled 2020-04-15 (×4): qty 1000

## 2020-04-15 MED ORDER — SODIUM CHLORIDE 0.9% FLUSH
10.0000 mL | Freq: Two times a day (BID) | INTRAVENOUS | Status: DC
  Administered 2020-04-15 – 2020-04-18 (×7): 10 mL

## 2020-04-15 MED ORDER — FENTANYL CITRATE (PF) 100 MCG/2ML IJ SOLN: 100.0000 ug | INTRAMUSCULAR | Status: AC

## 2020-04-15 MED ORDER — ALBUMIN HUMAN 5 % IV SOLN
25.0000 g | Freq: Once | INTRAVENOUS | Status: DC
  Filled 2020-04-15: qty 500

## 2020-04-15 MED ORDER — CHLORHEXIDINE GLUCONATE CLOTH 2 % EX PADS
6.0000 | MEDICATED_PAD | Freq: Every day | CUTANEOUS | Status: AC
  Administered 2020-04-15 – 2020-04-19 (×6): 6 via TOPICAL

## 2020-04-15 MED ORDER — SODIUM CHLORIDE 3 % IV SOLN
INTRAVENOUS | Status: DC
  Filled 2020-04-15: qty 500

## 2020-04-15 MED ORDER — SODIUM CHLORIDE 0.9 % IV SOLN: 250.0000 mL | INTRAVENOUS | Status: DC

## 2020-04-15 MED ORDER — MIDAZOLAM HCL 2 MG/2ML IJ SOLN
INTRAMUSCULAR | Status: AC
  Filled 2020-04-15: qty 2

## 2020-04-15 MED ORDER — HYDROMORPHONE HCL 1 MG/ML IJ SOLN
INTRAMUSCULAR | Status: AC
  Administered 2020-04-15: 1 mg via INTRAVENOUS
  Filled 2020-04-15: qty 1

## 2020-04-15 MED ORDER — MIDAZOLAM HCL 2 MG/2ML IJ SOLN
2.0000 mg | INTRAMUSCULAR | Status: AC
  Administered 2020-04-15: 2 mg via INTRAVENOUS

## 2020-04-15 MED ORDER — ENOXAPARIN SODIUM 30 MG/0.3ML ~~LOC~~ SOLN: 30.0000 mg | SUBCUTANEOUS | Status: DC

## 2020-04-15 MED ORDER — METHYLPREDNISOLONE SODIUM SUCC 40 MG IJ SOLR
40.0000 mg | Freq: Three times a day (TID) | INTRAMUSCULAR | Status: DC
  Administered 2020-04-15 – 2020-04-19 (×12): 40 mg via INTRAVENOUS
  Filled 2020-04-15 (×12): qty 1

## 2020-04-15 MED ORDER — SODIUM CHLORIDE 0.9 % IV SOLN
1.0000 mg/h | INTRAVENOUS | Status: DC
  Administered 2020-04-16: 1 mg/h via INTRAVENOUS
  Administered 2020-04-18 – 2020-04-23 (×4): 0.5 mg/h via INTRAVENOUS
  Filled 2020-04-15 (×4): qty 5

## 2020-04-15 MED ORDER — ENOXAPARIN SODIUM 40 MG/0.4ML ~~LOC~~ SOLN
40.0000 mg | SUBCUTANEOUS | Status: DC
  Administered 2020-04-15 – 2020-04-20 (×6): 40 mg via SUBCUTANEOUS
  Filled 2020-04-15 (×6): qty 0.4

## 2020-04-15 MED ORDER — HYDROMORPHONE HCL 1 MG/ML IJ SOLN
1.0000 mg | INTRAMUSCULAR | Status: DC | PRN
  Administered 2020-04-17 – 2020-04-18 (×2): 1 mg via INTRAVENOUS
  Filled 2020-04-15: qty 1

## 2020-04-15 MED ORDER — NOREPINEPHRINE 4 MG/250ML-% IV SOLN
2.0000 ug/min | INTRAVENOUS | Status: DC
  Administered 2020-04-15: 6 ug/min via INTRAVENOUS
  Administered 2020-04-15: 10 ug/min via INTRAVENOUS
  Administered 2020-04-19: 2 ug/min via INTRAVENOUS
  Filled 2020-04-15 (×3): qty 250

## 2020-04-15 MED ORDER — ALBUMIN HUMAN 25 % IV SOLN
25.0000 g | Freq: Once | INTRAVENOUS | Status: AC
  Administered 2020-04-15: 25 g via INTRAVENOUS

## 2020-04-15 MED ORDER — ETOMIDATE 2 MG/ML IV SOLN: 20.0000 mg | INTRAVENOUS | Status: AC

## 2020-04-15 MED ORDER — SODIUM CHLORIDE 0.9% FLUSH: 10.0000 mL | INTRAVENOUS | Status: DC | PRN

## 2020-04-15 MED ORDER — DEXMEDETOMIDINE HCL IN NACL 400 MCG/100ML IV SOLN
0.4000 ug/kg/h | INTRAVENOUS | Status: DC
  Administered 2020-04-15: 0.4 ug/kg/h via INTRAVENOUS
  Filled 2020-04-15: qty 100

## 2020-04-15 MED ORDER — FENTANYL CITRATE (PF) 100 MCG/2ML IJ SOLN: 100.0000 ug | Freq: Once | INTRAMUSCULAR | Status: AC

## 2020-04-15 MED ORDER — DEXTROSE 50 % IV SOLN: 25.0000 g | INTRAVENOUS | Status: AC

## 2020-04-15 MED ORDER — ETOMIDATE 2 MG/ML IV SOLN
INTRAVENOUS | Status: AC
  Administered 2020-04-15: 20 mg via INTRAVENOUS
  Filled 2020-04-15: qty 10

## 2020-04-15 MED ORDER — INFLUENZA VAC A&B SA ADJ QUAD 0.5 ML IM PRSY
0.5000 mL | PREFILLED_SYRINGE | INTRAMUSCULAR | Status: DC
  Filled 2020-04-15: qty 0.5

## 2020-04-15 MED ORDER — DEXTROSE 50 % IV SOLN
INTRAVENOUS | Status: AC
  Administered 2020-04-15: 25 g via INTRAVENOUS
  Filled 2020-04-15: qty 50

## 2020-04-15 MED ORDER — MUPIROCIN 2 % EX OINT
1.0000 "application " | TOPICAL_OINTMENT | Freq: Two times a day (BID) | CUTANEOUS | Status: AC
  Administered 2020-04-15 – 2020-04-19 (×9): 1 via NASAL
  Filled 2020-04-15 (×2): qty 22

## 2020-04-15 MED ORDER — DEXMEDETOMIDINE HCL IN NACL 400 MCG/100ML IV SOLN
0.4000 ug/kg/h | INTRAVENOUS | Status: DC
  Administered 2020-04-15: 0.6 ug/kg/h via INTRAVENOUS
  Administered 2020-04-15: 1.2 ug/kg/h via INTRAVENOUS
  Administered 2020-04-16: 0.8 ug/kg/h via INTRAVENOUS
  Administered 2020-04-16: 0.4 ug/kg/h via INTRAVENOUS
  Administered 2020-04-16: 1.2 ug/kg/h via INTRAVENOUS
  Administered 2020-04-17: 1 ug/kg/h via INTRAVENOUS
  Administered 2020-04-17: 0.6 ug/kg/h via INTRAVENOUS
  Administered 2020-04-18: 0.8 ug/kg/h via INTRAVENOUS
  Administered 2020-04-18: 0.6 ug/kg/h via INTRAVENOUS
  Filled 2020-04-15 (×9): qty 100

## 2020-04-15 MED ORDER — DEXTROSE 50 % IV SOLN
1.0000 | Freq: Once | INTRAVENOUS | Status: AC
  Administered 2020-04-15: 50 mL via INTRAVENOUS
  Filled 2020-04-15: qty 50

## 2020-04-15 MED ORDER — DEXTROSE 50 % IV SOLN
12.5000 g | INTRAVENOUS | Status: AC
  Administered 2020-04-15: 12.5 g via INTRAVENOUS

## 2020-04-15 NOTE — Progress Notes (Signed)
Peripherally Inserted Central Catheter Placement  The IV Nurse has discussed with the patient and/or persons authorized to consent for the patient, the purpose of this procedure and the potential benefits and risks involved with this procedure.  The benefits include less needle sticks, lab draws from the catheter, and the patient may be discharged home with the catheter. Risks include, but not limited to, infection, bleeding, blood clot (thrombus formation), and puncture of an artery; nerve damage and irregular heartbeat and possibility to perform a PICC exchange if needed/ordered by physician.  Alternatives to this procedure were also discussed.  Bard Power PICC patient education guide, fact sheet on infection prevention and patient information card has been provided to patient /or left at bedside.    PICC Placement Documentation  PICC Triple Lumen 04/15/20 PICC Right Brachial 40 cm 0 cm (Active)  Indication for Insertion or Continuance of Line Administration of hyperosmolar/irritating solutions (i.e. TPN, Vancomycin, etc.) 04/15/20 1805  Exposed Catheter (cm) 0 cm 04/15/20 1805  Site Assessment Clean;Dry;Intact 04/15/20 1805  Lumen #1 Status Flushed;Blood return noted;Saline locked 04/15/20 1805  Lumen #2 Status Flushed;Blood return noted;Saline locked 04/15/20 1805  Lumen #3 Status Flushed;Blood return noted;Saline locked 04/15/20 1805  Dressing Type Transparent 04/15/20 1805  Dressing Status Clean;Dry;Intact 04/15/20 1805  Antimicrobial disc in place? Yes 04/15/20 1805  Dressing Change Due 04/22/20 04/15/20 1805   Medical necessity    Audrie Gallus 04/15/2020, 6:16 PM

## 2020-04-15 NOTE — Progress Notes (Signed)
Dr. Cherylann Ratel notified that patient needed PICC for multiple infusions, poor access, pressor if it is ok with him to put a PICC line. He said ok with the PICC line insertion.   Dr. Earlie Server said ok for PICC too.   IV nurse made aware.

## 2020-04-15 NOTE — Progress Notes (Signed)
PHARMACY CONSULT NOTE - FOLLOW UP  Pharmacy Consult for Electrolyte Monitoring and Replacement   Recent Labs: Potassium (mmol/L)  Date Value  04/15/2020 3.5   Magnesium (mg/dL)  Date Value  27/25/3664 2.1   Calcium (mg/dL)  Date Value  40/34/7425 9.1   Albumin (g/dL)  Date Value  95/63/8756 3.9   Phosphorus (mg/dL)  Date Value  43/32/9518 4.6   Sodium (mmol/L)  Date Value  04/15/2020 115 (LL)   Assessment: Pt is a 75 y/o M who presented with falls. Pt presented with weakness and fell multiple times and had been more dizzy than usual with difficulty walking. Currently abuses EtOH and unknown amount of alcohol daily. Patient has acute renal failure secondary to ATN and severe hyponatremia likely due to beer potomania. Pharmacy has been consulted to assist with electrolyte management and replacement as indicated.   Creatinine: Scr 1.65>1.5>1.75  Fluids: - Hypertonic saline 3% CI @ 63mL/hr  - D10+NS @ 50 mL/hr  Date    Time    Sodium 0323    2222       Na 111 0323    2326       Na 112 0324    0406       Na 115   Goal of Therapy:  Electrolytes within normal limits  Plan:  - Na & Cl (improving): Continue 3% saline @ 30 mL/hr and D10+NS @ 50 mL/hr - K 5.6>3.5: Novolog 10 units given @ 0534 - Monitor electrolytes with AM labs   Philis Fendt , Student- PharmD 04/15/2020 2:50 PM

## 2020-04-15 NOTE — Progress Notes (Signed)
Took over care for this patient at 0615. Pt had a blood sugar of 55. Treatment was administered. Glucometer used was #4. This machine is not transmitting data into epic. Recheck of blood sugar was 145.

## 2020-04-15 NOTE — Consult Note (Signed)
[facility] CONSULT NOTE    Date: 04/15/2020                  Patient Name:  Adam Arnold  MRN: [id number]  DOB: 1945/06/22  Age / Sex: 75 y.o., male         PCP: Patient, No Pcp Per                 Service Requesting Consult:  Critical care                 Reason for Consult:  Severe hyponatremia            History of Present Illness: Patient is a 75 y.o. male with a PMHx of prior history of CVA, alcohol abuse, tobacco abuse, who was admitted to [facility] on 03/30/2020 for evaluation of multiple family history and alterations in sensorium.  [facility] has [facility].  He has experienced multiple falls recently.  He was found on the ground crawling by a Armed forces technical officer.  Patient unable to offer any history at this time.  We will called yesterday evening.  We were advised that serum sodium was normal at 110.  We advised that he be started on 3% saline at 30 cc/h.  Serum sodium this a.m. is up to 115.  He is in soft wrist restraints and at the moment not following commands.  In addition potassium is a bit high at 5.6.  BUN also 33 with a creatinine of 1.75.  Unclear as to what his baseline creatinine is at the moment.   Medications: Outpatient medications: No medications prior to admission.    Current medications: Current Facility-Administered Medications  Medication Dose Route Frequency Provider Last Rate Last Admin  . acetaminophen (TYLENOL) tablet 650 mg  650 mg Oral Q4H PRN Eugenie Norrie, NP   650 mg at 04/02/2020 2244  . Chlorhexidine Gluconate Cloth 2 % PADS 6 each  6 each Topical Q0600 Eugenie Norrie, NP   6 each at 04/15/20 0539  . dexmedetomidine (PRECEDEX) 400 MCG/100ML (4 mcg/mL) infusion  0.4-1.2 mcg/kg/hr Intravenous Titrated Eugenie Norrie, NP   Stopped at 04/15/20 [phone]  . docusate sodium (COLACE) capsule 100 mg  100 mg Oral BID PRN Eugenie Norrie, NP      . enoxaparin (LOVENOX) injection 30 mg  30 mg Subcutaneous Q24H Reatha Armour, RPH      . folic acid (FOLVITE) tablet 1 mg  1 mg Oral Daily Eugenie Norrie, NP      . Melene Muller ON 04/16/2020] influenza vaccine adjuvanted (FLUAD) injection 0.5 mL  0.5 mL Intramuscular Tomorrow-1000 Eugenie Norrie, NP      . LORazepam (ATIVAN) tablet 1-4 mg  1-4 mg Oral Q1H PRN Eugenie Norrie, NP   2 mg at 04/15/20 0004   Or  . LORazepam (ATIVAN) injection 1-4 mg  1-4 mg Intravenous Q1H PRN Eugenie Norrie, NP   2 mg at 04/15/20 0353  . multivitamin with minerals tablet 1 tablet  1 tablet Oral Daily Eugenie Norrie, NP      . mupirocin ointment (BACTROBAN) 2 % 1 application  1 application Nasal BID Eugenie Norrie, NP      . ondansetron (ZOFRAN) injection 4 mg  4 mg Intravenous Q6H PRN Eugenie Norrie, NP      . oxyCODONE-acetaminophen (PERCOCET/ROXICET) 5-325 MG per tablet 1-2 tablet  1-2 tablet Oral Q6H PRN Eugenie Norrie,  NP      . polyethylene glycol (MIRALAX / GLYCOLAX) packet 17 g  17 g Oral Daily PRN Eugenie Norrie, NP      . sodium chloride (hypertonic) 3 % solution   Intravenous Continuous Chesley Noon, MD 30 mL/hr at 04/15/20 0600 Infusion Verify at 04/15/20 0600  . thiamine 500mg  in normal saline (54ml) IVPB  500 mg Intravenous Q24H 45m, NP          Allergies: No Known Allergies    Past Medical History: Past Medical History:  Diagnosis Date  . Stroke [facility])      Past Surgical History: Past Surgical History:  Procedure Laterality Date  . HERNIA REPAIR       Family History: History reviewed. No pertinent family history.   Social History: Social History   Socioeconomic History  . Marital status: Single    Spouse name: Not on file  . Number of children: Not on file  . Years of education: Not on file  . Highest education level: Not on file  Occupational History  . Not on file  Tobacco Use  . Smoking status: Current Every Day Smoker    Packs/day: 1.00    Types: Cigarettes  . Smokeless tobacco: Never Used  Substance and  Sexual Activity  . Alcohol use: Yes    Comment: 5-6 beers per day  . Drug use: Not Currently  . Sexual activity: Not on file  Other Topics Concern  . Not on file  Social History Narrative  . Not on file   Social Determinants of Health   Financial Resource Strain: Not on file  Food Insecurity: Not on file  Transportation Needs: Not on file  Physical Activity: Not on file  Stress: Not on file  Social Connections: Not on file  Intimate Partner Violence: Not on file     Review of Systems: Patient unable to offer review of systems secondary to altered mental status  Vital Signs: Blood pressure (!) 96/48, pulse 75, temperature (!) 96.2 F (35.7 C), temperature source Axillary, resp. rate (!) 21, height 5\' 11"  (1.803 m), weight 57.3 kg, SpO2 93 %.  Weight trends: Filed Weights   04/02/2020 2038 04/15/20 0000 04/15/20 0426  Weight: 59.9 kg 57.3 kg 57.3 kg    Physical Exam: Physical Exam: General:  Critically ill-appearing  Head:  Normocephalic, atraumatic. Moist oral mucosal membranes  Eyes:  Anicteric  Neck:  Supple  Lungs:   Clear to auscultation, normal effort  Heart:  S1S2 no rubs  Abdomen:   Soft, nontender, bowel sounds present  Extremities:  No peripheral edema.  Neurologic:  Lethargic, difficult to arouse  Skin:  No acute skin rashes  GU:  No suprapubic tenderness    Lab results: Basic Metabolic Panel: Recent Labs  Lab 04/11/2020 2039 04/22/2020 2222 04/17/2020 2326 04/15/20 0406  NA 110* 111* 112* 115*  K 4.6  --   --  5.6*  CL 72*  --   --  77*  CO2 20*  --   --  24  GLUCOSE 102*  --   --  80  BUN 28*  --   --  32*  CREATININE 1.65* 1.50*  --  1.75*  CALCIUM 8.9  --   --  9.1  MG  --  2.1  --  2.1  PHOS  --  3.9  --  4.6    Liver Function Tests: Recent Labs  Lab 04/15/2020 2039 04/15/20 0406  AST 75* 84*  ALT 30 29  ALKPHOS 99 100  BILITOT 1.3* 1.2  PROT 7.0 6.8  ALBUMIN 3.9 3.9   No results for input(s): LIPASE, AMYLASE in the last 168  hours. No results for input(s): AMMONIA in the last 168 hours.  CBC: Recent Labs  Lab 30-Apr-2020 2039 2020/04/30 2222 04/15/20 0406  WBC 11.2* 10.0 11.0*  NEUTROABS 9.4*  --   --   HGB 13.5 13.6 13.5  HCT RESULTS UNAVAILABLE DUE TO INTERFERING SUBSTANCE RESULTS UNAVAILABLE DUE TO INTERFERING SUBSTANCE RESULTS UNAVAILABLE DUE TO INTERFERING SUBSTANCE  MCV RESULTS UNAVAILABLE DUE TO INTERFERING SUBSTANCE RESULTS UNAVAILABLE DUE TO INTERFERING SUBSTANCE RESULTS UNAVAILABLE DUE TO INTERFERING SUBSTANCE  PLT 230 220 218    Cardiac Enzymes: No results for input(s): CKTOTAL, CKMB, CKMBINDEX, TROPONINI in the last 168 hours.  BNP: Invalid input(s): POCBNP  CBG: Recent Labs  Lab 04/15/20 0618 04/15/20 0640 04/15/20 0806 04/15/20 0807 04/15/20 0830  GLUCAP 55* 145* 31* 30* 193*  193*  193*  193*    Microbiology: Results for orders placed or performed during the hospital encounter of 2020/04/30  Resp Panel by RT-PCR (Flu A&B, Covid) Nasopharyngeal Swab     Status: None   Collection Time: 2020/04/30 10:22 PM   Specimen: Nasopharyngeal Swab; Nasopharyngeal(NP) swabs in vial transport medium  Result Value Ref Range Status   SARS Coronavirus 2 by RT PCR NEGATIVE NEGATIVE Final    Comment: (NOTE) SARS-CoV-2 target nucleic acids are NOT DETECTED.  The SARS-CoV-2 RNA is generally detectable in upper respiratory specimens during the acute phase of infection. The lowest concentration of SARS-CoV-2 viral copies this assay can detect is 138 copies/mL. A negative result does not preclude SARS-Cov-2 infection and should not be used as the sole basis for treatment or other patient management decisions. A negative result may occur with  improper specimen collection/handling, submission of specimen other than nasopharyngeal swab, presence of viral mutation(s) within the areas targeted by this assay, and inadequate number of viral copies(<138 copies/mL). A negative result must be combined  with clinical observations, patient history, and epidemiological information. The expected result is Negative.  Fact Sheet for Patients:  BloggerCourse.com  Fact Sheet for Healthcare Providers:  SeriousBroker.it  This test is no t yet approved or cleared by the [place] FDA and  has been authorized for detection and/or diagnosis of SARS-CoV-2 by FDA under an Emergency Use Authorization (EUA). This EUA will remain  in effect (meaning this test can be used) for the duration of the COVID-19 declaration under Section 564(b)(1) of the Act, 21 U.S.C.section 360bbb-3(b)(1), unless the authorization is terminated  or revoked sooner.       Influenza A by PCR NEGATIVE NEGATIVE Final   Influenza B by PCR NEGATIVE NEGATIVE Final    Comment: (NOTE) The Xpert Xpress SARS-CoV-2/FLU/RSV plus assay is intended as an aid in the diagnosis of influenza from Nasopharyngeal swab specimens and should not be used as a sole basis for treatment. Nasal washings and aspirates are unacceptable for Xpert Xpress SARS-CoV-2/FLU/RSV testing.  Fact Sheet for Patients: BloggerCourse.com  Fact Sheet for Healthcare Providers: SeriousBroker.it  This test is not yet approved or cleared by the [place] FDA and has been authorized for detection and/or diagnosis of SARS-CoV-2 by FDA under an Emergency Use Authorization (EUA). This EUA will remain in effect (meaning this test can be used) for the duration of the COVID-19 declaration under Section 564(b)(1) of the Act, 21 U.S.C. section 360bbb-3(b)(1), unless the authorization is terminated or revoked.  Performed at  [facility], [address] [place], [place] [address]   MRSA PCR Screening     Status: Abnormal   Collection Time: 04/16/2020 11:35 PM   Specimen: Nasal Mucosa; Nasopharyngeal  Result Value Ref Range Status   MRSA by PCR POSITIVE  (A) NEGATIVE Final    Comment:        The GeneXpert MRSA Assay (FDA approved for NASAL specimens only), is one component of a comprehensive MRSA colonization surveillance program. It is not intended to diagnose MRSA infection nor to guide or monitor treatment for MRSA infections. RESULT CALLED TO, READ BACK BY AND VERIFIED WITH: CINTHIA VEZQUEZ @0400  04/15/20 RH Performed at [facility], [address] [place], [place] [address]     Coagulation Studies: No results for input(s): LABPROT, INR in the last 72 hours.  Urinalysis: Recent Labs    03/30/2020 2043  COLORURINE YELLOW*  LABSPEC 1.006  PHURINE 6.0  GLUCOSEU NEGATIVE  HGBUR MODERATE*  BILIRUBINUR NEGATIVE  KETONESUR 5*  PROTEINUR 30*  NITRITE NEGATIVE  LEUKOCYTESUR NEGATIVE      Imaging: DG Chest 2 View  Result Date: 04/18/2020 CLINICAL DATA:  75 year old male with weakness.  Fall. EXAM: CHEST - 2 VIEW COMPARISON:  Chest radiograph dated 11/22/2009. FINDINGS: Background of mild emphysema and chronic bronchitic changes. No focal consolidation, pleural effusion or pneumothorax. The cardiac silhouette is within limits. Osteopenia with mild degenerative changes of the spine. Apparent focal area of cortical irregularity of the anterior left fifth rib may be artifactual or represent a nondisplaced fracture. Correlation with point tenderness recommended. IMPRESSION: 1. No active cardiopulmonary disease. 2. Artifact versus a nondisplaced fracture of the anterior left fifth rib. Electronically Signed   By: Elgie CollardArash  Radparvar M.D.   On: 04/16/2020 21:19   CT Head Wo Contrast  Result Date: 04/05/2020 CLINICAL DATA:  Larey SeatFell, facial abrasions EXAM: CT HEAD WITHOUT CONTRAST TECHNIQUE: Contiguous axial images were obtained from the base of the skull through the vertex without intravenous contrast. COMPARISON:  11/22/2009 FINDINGS: Brain: Hypodensities in the bilateral frontal periventricular white matter consistent with chronic  small vessel ischemic changes. Evidence of prior right frontal burr hole with minimal right frontal cortical encephalomalacia. No signs of acute infarct or hemorrhage. Lateral ventricles and midline structures are unremarkable. No acute extra-axial fluid collections. No mass effect. Vascular: No hyperdense vessel or unexpected calcification. Skull: Prior right frontal burr hole. Remainder of the calvarium is unremarkable. Sinuses/Orbits: Diffuse mucoperiosteal thickening throughout the ethmoid and maxillary sinuses. Other: None. IMPRESSION: 1. No acute intracranial process. 2. Chronic small vessel ischemic changes in the bilateral frontal white matter. Electronically Signed   By: Sharlet SalinaMichael  Brown M.D.   On: 03/23/2020 21:13   CT Cervical Spine Wo Contrast  Result Date: 03/26/2020 CLINICAL DATA:  Larey SeatFell, facial abrasions EXAM: CT CERVICAL SPINE WITHOUT CONTRAST TECHNIQUE: Multidetector CT imaging of the cervical spine was performed without intravenous contrast. Multiplanar CT image reconstructions were also generated. COMPARISON:  None. FINDINGS: Alignment: Alignment is grossly anatomic. Skull base and vertebrae: No acute fracture. No primary bone lesion or focal pathologic process. Soft tissues and spinal canal: No prevertebral fluid or swelling. No visible canal hematoma. Extensive atherosclerosis at the carotid bifurcations. Disc levels: Prominent spondylosis at C4-5, C5-6, and C6-7, with associated facet hypertrophy. There is bony fusion across the C4-5 facet joints. There is symmetrical neural foraminal narrowing at C4-5, C5-6, and C6-7. Upper chest: Airway is patent. Mild emphysematous changes at the lung apices. Other: Reconstructed images demonstrate no additional findings. IMPRESSION: 1.  No acute cervical spine fracture. 2. Multilevel spondylosis and facet hypertrophy greatest from C4-5 through C6-7. Electronically Signed   By: Sharlet Salina M.D.   On: 04/04/2020 21:16      Assessment & Plan: Pt is a  75 y.o. male with a PMHx of prior history of CVA, alcohol abuse, tobacco abuse, who was admitted to [facility] on 04/03/2020 for evaluation of multiple family history and alterations in sensorium.  Patient has been residing at a local motel  1.  Hyponatremia.  Suspect due to volume depletion from alcohol abuse.  Presenting serum sodium was 110.  He was started on 3% saline at 30 cc/h under our recommendation.  Serum sodium up to 115.  Target for today is 118 since we started 3% saline late yesterday.  2.  Hyperkalemia.  Serum potassium 5.6.  Check serum cortisol level.  3.  Hypotension.  Patient with relative hypotension.  3% saline should provide some volume expansion.  Consider pressors as necessary if pressure drops further.  4.  Alcohol abuse.  Prevention of withdrawal as per pulmonary/critical care.

## 2020-04-15 NOTE — Progress Notes (Signed)
Patient's wallet with DL, [facility] ID card, [employer] visa card and $264 in cash put in patient valuables envelope and sent down with security to be locked. Yellow copy placed in chart along with key for locker number [id number]. No other valuables except clothing and shoes at bedside.

## 2020-04-15 NOTE — Consult Note (Signed)
[facility] Face-to-Face Psychiatry Consult   Reason for Consult: Consult for 75 year old man who presented to the emergency room with confusion and agitation multiple medical problems profoundly hyponatremic.  Required commitment in the ER to ensure treatment. Referring Physician:  Earlie Server Patient IdentificationShantel Arnold MRN:  [id number] Principal Diagnosis: Acute delirium Diagnosis:  Principal Problem:   Acute delirium Active Problems:   Hyponatremia   Alcohol abuse   Total Time spent with patient: 1 hour  Subjective:   Micahel Arnold is a 75 y.o. male patient admitted with patient is intubated and unconscious and unable to give any information.  HPI: 75 year old man presented to the emergency room from one of the local low-cost motels with confusion crawling around on the ground disruptive behavior.  Reported that the patient had been filthy when he came in and that his room was filled with beer bottles but by the report in the notes he had said he had not been drinking that day.  Patient was found to be profoundly hyponatremic with a sodium of 110.  Appropriate measures were taken and he was admitted to the intensive care unit.  Midday today he required intubation.  Patient not able to give any other information at this time.  We do not have any information about possible medications.  We have not seen this patient before and his name does not appear to come up on the care everywhere so I do not have any other outside information.  There is no contact listed in the chart.  Past Psychiatric History: Unknown.  Evidently a heavy drinker but all other details unknown.  They did get a urine drug screen and that is negative.  Risk to Self:   Risk to Others:   Prior Inpatient Therapy:   Prior Outpatient Therapy:    Past Medical History:  Past Medical History:  Diagnosis Date  . Stroke [facility])     Past Surgical History:  Procedure Laterality Date  . HERNIA REPAIR     Family History: History  reviewed. No pertinent family history. Family Psychiatric  History: Unknown Social History:  Social History   Substance and Sexual Activity  Alcohol Use Yes   Comment: 5-6 beers per day     Social History   Substance and Sexual Activity  Drug Use Not Currently    Social History   Socioeconomic History  . Marital status: Single    Spouse name: Not on file  . Number of children: Not on file  . Years of education: Not on file  . Highest education level: Not on file  Occupational History  . Not on file  Tobacco Use  . Smoking status: Current Every Day Smoker    Packs/day: 1.00    Types: Cigarettes  . Smokeless tobacco: Never Used  Substance and Sexual Activity  . Alcohol use: Yes    Comment: 5-6 beers per day  . Drug use: Not Currently  . Sexual activity: Not on file  Other Topics Concern  . Not on file  Social History Narrative  . Not on file   Social Determinants of Health   Financial Resource Strain: Not on file  Food Insecurity: Not on file  Transportation Needs: Not on file  Physical Activity: Not on file  Stress: Not on file  Social Connections: Not on file   Additional Social History:    Allergies:  No Known Allergies  Labs:  Results for orders placed or performed during the hospital encounter of Apr 27, 2020 (from the past 48  hour(s))  CBC with Differential     Status: Abnormal   Collection Time: 04-26-2020  8:39 PM  Result Value Ref Range   WBC 11.2 (H) 4.0 - 10.5 K/uL   RBC RESULTS UNAVAILABLE DUE TO INTERFERING SUBSTANCE 4.22 - 5.81 MIL/uL   Hemoglobin 13.5 13.0 - 17.0 g/dL   HCT RESULTS UNAVAILABLE DUE TO INTERFERING SUBSTANCE 39.0 - 52.0 %   MCV RESULTS UNAVAILABLE DUE TO INTERFERING SUBSTANCE 80.0 - 100.0 fL   MCH RESULTS UNAVAILABLE DUE TO INTERFERING SUBSTANCE 26.0 - 34.0 pg   MCHC RESULTS UNAVAILABLE DUE TO INTERFERING SUBSTANCE 30.0 - 36.0 g/dL   RDW RESULTS UNAVAILABLE DUE TO INTERFERING SUBSTANCE 11.5 - 15.5 %   Platelets 230 150 - 400 K/uL    nRBC 0.0 0.0 - 0.2 %   Neutrophils Relative % 84 %   Neutro Abs 9.4 (H) 1.7 - 7.7 K/uL   Lymphocytes Relative 7 %   Lymphs Abs 0.7 0.7 - 4.0 K/uL   Monocytes Relative 8 %   Monocytes Absolute 0.9 0.1 - 1.0 K/uL   Eosinophils Relative 0 %   Eosinophils Absolute 0.0 0.0 - 0.5 K/uL   Basophils Relative 0 %   Basophils Absolute 0.0 0.0 - 0.1 K/uL   WBC Morphology MORPHOLOGY UNREMARKABLE    RBC Morphology MORPHOLOGY UNREMARKABLE    Smear Review Normal platelet morphology    Immature Granulocytes 1 %   Abs Immature Granulocytes 0.10 (H) 0.00 - 0.07 K/uL    Comment: Performed at [facility], [address] [place], [place] [address]  Comprehensive metabolic panel     Status: Abnormal   Collection Time: 04/26/20  8:39 PM  Result Value Ref Range   Sodium 110 (LL) 135 - 145 mmol/L    Comment: CRITICAL RESULT CALLED TO, READ BACK BY AND VERIFIED WITH MARY ANDERSON @2132  ON 2020/04/26 SKL    Potassium 4.6 3.5 - 5.1 mmol/L   Chloride 72 (L) 98 - 111 mmol/L   CO2 20 (L) 22 - 32 mmol/L   Glucose, Bld 102 (H) 70 - 99 mg/dL    Comment: Glucose reference range applies only to samples taken after fasting for at least 8 hours.   BUN 28 (H) 8 - 23 mg/dL   Creatinine, Ser 04/16/20 (H) 0.61 - 1.24 mg/dL   Calcium 8.9 8.9 - 8.75 mg/dL   Total Protein 7.0 6.5 - 8.1 g/dL   Albumin 3.9 3.5 - 5.0 g/dL   AST 75 (H) 15 - 41 U/L   ALT 30 0 - 44 U/L   Alkaline Phosphatase 99 38 - 126 U/L   Total Bilirubin 1.3 (H) 0.3 - 1.2 mg/dL   GFR, Estimated 44 (L) >60 mL/min    Comment: (NOTE) Calculated using the CKD-EPI Creatinine Equation (2021)    Anion gap 18 (H) 5 - 15    Comment: Performed at [facility], [address] [place], [place]  Ethanol     Status: None   Collection Time: 04/26/2020  8:39 PM  Result Value Ref Range   Alcohol, Ethyl (B) <10 <10 mg/dL    Comment: (NOTE) Lowest detectable limit for serum alcohol is 10 mg/dL.  For medical purposes only. Performed at  [facility], [address] [place], [place]   Urinalysis, Complete w Microscopic     Status: Abnormal   Collection Time: 26-Apr-2020  8:43 PM  Result Value Ref Range   Color, Urine YELLOW (A) YELLOW   APPearance CLEAR (A)  CLEAR   Specific Gravity, Urine 1.006 1.005 - 1.030   pH 6.0 5.0 - 8.0   Glucose, UA NEGATIVE NEGATIVE mg/dL   Hgb urine dipstick MODERATE (A) NEGATIVE   Bilirubin Urine NEGATIVE NEGATIVE   Ketones, ur 5 (A) NEGATIVE mg/dL   Protein, ur 30 (A) NEGATIVE mg/dL   Nitrite NEGATIVE NEGATIVE   Leukocytes,Ua NEGATIVE NEGATIVE   RBC / HPF 0-5 0 - 5 RBC/hpf   WBC, UA 0-5 0 - 5 WBC/hpf   Bacteria, UA NONE SEEN NONE SEEN   Squamous Epithelial / LPF 0-5 0 - 5   Sperm, UA PRESENT     Comment: Performed at [facility], [address], [place], [place] [address]  Urine Drug Screen, Qualitative     Status: None   Collection Time: May 11, 2020  8:43 PM  Result Value Ref Range   Tricyclic, Ur Screen NONE DETECTED NONE DETECTED   Amphetamines, Ur Screen NONE DETECTED NONE DETECTED   MDMA (Ecstasy)Ur Screen NONE DETECTED NONE DETECTED   Cocaine Metabolite,Ur [place] NONE DETECTED NONE DETECTED   Opiate, Ur Screen NONE DETECTED NONE DETECTED   Phencyclidine (PCP) Ur S NONE DETECTED NONE DETECTED   Cannabinoid 50 Ng, Ur [place] NONE DETECTED NONE DETECTED   Barbiturates, Ur Screen NONE DETECTED NONE DETECTED   Benzodiazepine, Ur Scrn NONE DETECTED NONE DETECTED   Methadone Scn, Ur NONE DETECTED NONE DETECTED    Comment: (NOTE) Tricyclics + metabolites, urine    Cutoff 1000 ng/mL Amphetamines + metabolites, urine  Cutoff 1000 ng/mL MDMA (Ecstasy), urine              Cutoff 500 ng/mL Cocaine Metabolite, urine          Cutoff 300 ng/mL Opiate + metabolites, urine        Cutoff 300 ng/mL Phencyclidine (PCP), urine         Cutoff 25 ng/mL Cannabinoid, urine                 Cutoff 50 ng/mL Barbiturates + metabolites, urine  Cutoff 200 ng/mL Benzodiazepine, urine               Cutoff 200 ng/mL Methadone, urine                   Cutoff 300 ng/mL  The urine drug screen provides only a preliminary, unconfirmed analytical test result and should not be used for non-medical purposes. Clinical consideration and professional judgment should be applied to any positive drug screen result due to possible interfering substances. A more specific alternate chemical method must be used in order to obtain a confirmed analytical result. Gas chromatography / mass spectrometry (GC/MS) is the preferred confirm atory method. Performed at [facility], [address] [place], [place] [address]   Osmolality, urine     Status: Abnormal   Collection Time: 05-11-20  8:43 PM  Result Value Ref Range   Osmolality, Ur 193 (L) 300 - 900 mOsm/kg    Comment: Performed at [facility], [address] [place], [place] [address]  Sodium, urine, random     Status: None   Collection Time: 2020/05/11  8:43 PM  Result Value Ref Range   Sodium, Ur <10 mmol/L    Comment: Performed at [facility], [address] [place], [place] [address]  Troponin I (High Sensitivity)     Status: Abnormal   Collection Time: 05/11/20  8:51 PM  Result Value Ref Range   Troponin I (High  Sensitivity) 24 (H) <18 ng/L    Comment: (NOTE) Elevated high sensitivity troponin I (hsTnI) values and significant  changes across serial measurements may suggest ACS but many other  chronic and acute conditions are known to elevate hsTnI results.  Refer to the "Links" section for chest pain algorithms and additional  guidance. Performed at [facility], [address] [place], [place] [address]   Sodium     Status: Abnormal   Collection Time: 05-05-2020 10:22 PM  Result Value Ref Range   Sodium 111 (LL) 135 - 145 mmol/L    Comment: CRITICAL RESULT CALLED TO, READ BACK BY AND VERIFIED WITH MARY ANDERSON @2314  ON 05/05/20 SKL Performed at [facility], [address] [place], [place] [address]   Osmolality     Status: Abnormal   Collection Time: May 05, 2020 10:22 PM  Result Value Ref Range   Osmolality 239 (LL) 275 - 295 mOsm/kg    Comment: CRITICAL RESULT CALLED TO, READ BACK BY AND VERIFIED WITH: MARY ANDERSON ON 05/05/20 @2322  SKL Performed at [facility], [address] [place], [place] [address]   Resp Panel by RT-PCR (Flu A&B, Covid) Nasopharyngeal Swab     Status: None   Collection Time: 05-May-2020 10:22 PM   Specimen: Nasopharyngeal Swab; Nasopharyngeal(NP) swabs in vial transport medium  Result Value Ref Range   SARS Coronavirus 2 by RT PCR NEGATIVE NEGATIVE    Comment: (NOTE) SARS-CoV-2 target nucleic acids are NOT DETECTED.  The SARS-CoV-2 RNA is generally detectable in upper respiratory specimens during the acute phase of infection. The lowest concentration of SARS-CoV-2 viral copies this assay can detect is 138 copies/mL. A negative result does not preclude SARS-Cov-2 infection and should not be used as the sole basis for treatment or other patient management decisions. A negative result may occur with  improper specimen collection/handling, submission of specimen other than nasopharyngeal swab, presence of viral mutation(s) within the areas targeted by this assay, and inadequate number of viral copies(<138 copies/mL). A negative result must be combined with clinical observations, patient history, and epidemiological information. The expected result is Negative.  Fact Sheet for Patients:  BloggerCourse.com  Fact Sheet for Healthcare Providers:  SeriousBroker.it  This test is no t yet approved or cleared by the [place] FDA and  has been authorized for detection and/or diagnosis of SARS-CoV-2 by FDA under an Emergency Use Authorization (EUA). This EUA will remain  in effect (meaning this test can be used) for the duration of the COVID-19 declaration under Section  564(b)(1) of the Act, 21 U.S.C.section 360bbb-3(b)(1), unless the authorization is terminated  or revoked sooner.       Influenza A by PCR NEGATIVE NEGATIVE   Influenza B by PCR NEGATIVE NEGATIVE    Comment: (NOTE) The Xpert Xpress SARS-CoV-2/FLU/RSV plus assay is intended as an aid in the diagnosis of influenza from Nasopharyngeal swab specimens and should not be used as a sole basis for treatment. Nasal washings and aspirates are unacceptable for Xpert Xpress SARS-CoV-2/FLU/RSV testing.  Fact Sheet for Patients: BloggerCourse.com  Fact Sheet for Healthcare Providers: SeriousBroker.it  This test is not yet approved or cleared by the [place] FDA and has been authorized for detection and/or diagnosis of SARS-CoV-2 by FDA under an Emergency Use Authorization (EUA). This EUA will remain in effect (meaning this test can be used) for the duration of the COVID-19 declaration under Section 564(b)(1) of the Act, 21 U.S.C. section 360bbb-3(b)(1), unless the authorization is terminated or revoked.  Performed at [facility], [address] [place], [place] [address]   CBC     Status: None   Collection Time: 2020/04/20 10:22 PM  Result Value Ref Range   WBC 10.0 4.0 - 10.5 K/uL   RBC RESULTS UNAVAILABLE DUE TO INTERFERING SUBSTANCE 4.22 - 5.81 MIL/uL   Hemoglobin 13.6 13.0 - 17.0 g/dL   HCT RESULTS UNAVAILABLE DUE TO INTERFERING SUBSTANCE 39.0 - 52.0 %   MCV RESULTS UNAVAILABLE DUE TO INTERFERING SUBSTANCE 80.0 - 100.0 fL   MCH RESULTS UNAVAILABLE DUE TO INTERFERING SUBSTANCE 26.0 - 34.0 pg   MCHC RESULTS UNAVAILABLE DUE TO INTERFERING SUBSTANCE 30.0 - 36.0 g/dL   RDW RESULTS UNAVAILABLE DUE TO INTERFERING SUBSTANCE 11.5 - 15.5 %   Platelets 220 150 - 400 K/uL   nRBC 0.0 0.0 - 0.2 %    Comment: Performed at [facility], [address] [place], [place] [address]  Creatinine, serum     Status: Abnormal    Collection Time: 04/20/20 10:22 PM  Result Value Ref Range   Creatinine, Ser 1.50 (H) 0.61 - 1.24 mg/dL   GFR, Estimated 49 (L) >60 mL/min    Comment: (NOTE) Calculated using the CKD-EPI Creatinine Equation (2021) Performed at [facility], [address], [place], [place] [address]   Magnesium     Status: None   Collection Time: 04-20-20 10:22 PM  Result Value Ref Range   Magnesium 2.1 1.7 - 2.4 mg/dL    Comment: Performed at [facility], [address] [place], [place] [address]  Phosphorus     Status: None   Collection Time: 20-Apr-2020 10:22 PM  Result Value Ref Range   Phosphorus 3.9 2.5 - 4.6 mg/dL    Comment: Performed at [facility], [address] [place], [place] [address]  Troponin I (High Sensitivity)     Status: Abnormal   Collection Time: 04-20-2020 10:22 PM  Result Value Ref Range   Troponin I (High Sensitivity) 21 (H) <18 ng/L    Comment: (NOTE) Elevated high sensitivity troponin I (hsTnI) values and significant  changes across serial measurements may suggest ACS but many other  chronic and acute conditions are known to elevate hsTnI results.  Refer to the "Links" section for chest pain algorithms and additional  guidance. Performed at [facility], [address] [place], [place] [address]   Glucose, capillary     Status: None   Collection Time: 2020/04/20 11:25 PM  Result Value Ref Range   Glucose-Capillary 84 70 - 99 mg/dL    Comment: Glucose reference range applies only to samples taken after fasting for at least 8 hours.  Sodium     Status: Abnormal   Collection Time: April 20, 2020 11:26 PM  Result Value Ref Range   Sodium 112 (LL) 135 - 145 mmol/L    Comment: CRITICAL RESULT CALLED TO, READ BACK BY AND VERIFIED WITH CYNTHIA VASQUEZ @2357  ON Apr 20, 2020 SKL Performed at [facility], [address] [place], [place] [address]   MRSA PCR Screening     Status: Abnormal   Collection Time: 04-20-20 11:35 PM    Specimen: Nasal Mucosa; Nasopharyngeal  Result Value Ref Range   MRSA by PCR POSITIVE (A) NEGATIVE    Comment:        The GeneXpert MRSA Assay (FDA approved for NASAL specimens only), is one component of a comprehensive MRSA colonization surveillance program. It is not intended to diagnose MRSA infection nor to guide or monitor treatment  for MRSA infections. RESULT CALLED TO, READ BACK BY AND VERIFIED WITH: CINTHIA VEZQUEZ @0400  04/15/20 RH Performed at [facility], [address] [place], [place] [address]   CBC     Status: Abnormal   Collection Time: 04/15/20  4:06 AM  Result Value Ref Range   WBC 11.0 (H) 4.0 - 10.5 K/uL   RBC RESULTS UNAVAILABLE DUE TO INTERFERING SUBSTANCE 4.22 - 5.81 MIL/uL   Hemoglobin 13.5 13.0 - 17.0 g/dL    Comment: CORRECTED ON 03/24 AT 0524: PREVIOUSLY REPORTED AS RESULTS UNAVAILABLE DUE TO INTERFERING SUBSTANCE   HCT RESULTS UNAVAILABLE DUE TO INTERFERING SUBSTANCE 39.0 - 52.0 %   MCV RESULTS UNAVAILABLE DUE TO INTERFERING SUBSTANCE 80.0 - 100.0 fL   MCH RESULTS UNAVAILABLE DUE TO INTERFERING SUBSTANCE 26.0 - 34.0 pg   MCHC RESULTS UNAVAILABLE DUE TO INTERFERING SUBSTANCE 30.0 - 36.0 g/dL   RDW RESULTS UNAVAILABLE DUE TO INTERFERING SUBSTANCE 11.5 - 15.5 %   Platelets 218 150 - 400 K/uL   nRBC 0.0 0.0 - 0.2 %    Comment: Performed at [facility], [address], [place], [place] [address]  Magnesium     Status: None   Collection Time: 04/15/20  4:06 AM  Result Value Ref Range   Magnesium 2.1 1.7 - 2.4 mg/dL    Comment: Performed at [facility], [address] [place], [place] [address]  Phosphorus     Status: None   Collection Time: 04/15/20  4:06 AM  Result Value Ref Range   Phosphorus 4.6 2.5 - 4.6 mg/dL    Comment: Performed at [facility], [address] [place], [place] [address]  Comprehensive metabolic panel     Status: Abnormal   Collection Time: 04/15/20  4:06 AM  Result Value Ref Range    Sodium 115 (LL) 135 - 145 mmol/L    Comment: CRITICAL RESULT CALLED TO, READ BACK BY AND VERIFIED WITH CYNTHIA VASQUEZ RN [phone] 04/15/20 HNM    Potassium 5.6 (H) 3.5 - 5.1 mmol/L   Chloride 77 (L) 98 - 111 mmol/L   CO2 24 22 - 32 mmol/L   Glucose, Bld 80 70 - 99 mg/dL    Comment: Glucose reference range applies only to samples taken after fasting for at least 8 hours.   BUN 32 (H) 8 - 23 mg/dL   Creatinine, Ser 6.20 (H) 0.61 - 1.24 mg/dL   Calcium 9.1 8.9 - 35.5 mg/dL   Total Protein 6.8 6.5 - 8.1 g/dL   Albumin 3.9 3.5 - 5.0 g/dL   AST 84 (H) 15 - 41 U/L   ALT 29 0 - 44 U/L   Alkaline Phosphatase 100 38 - 126 U/L   Total Bilirubin 1.2 0.3 - 1.2 mg/dL   GFR, Estimated 41 (L) >60 mL/min    Comment: (NOTE) Calculated using the CKD-EPI Creatinine Equation (2021)    Anion gap 14 5 - 15    Comment: Performed at [facility], [address] [place], [place] [address]  Glucose, capillary     Status: Abnormal   Collection Time: 04/15/20  6:18 AM  Result Value Ref Range   Glucose-Capillary 55 (L) 70 - 99 mg/dL    Comment: Glucose reference range applies only to samples taken after fasting for at least 8 hours.  Lactic acid, plasma     Status: None   Collection Time: 04/15/20  6:22 AM  Result Value Ref Range   Lactic Acid, Venous 1.8 0.5 - 1.9  mmol/L    Comment: Performed at [facility], [address] [place], [place] [address]  Glucose, capillary     Status: Abnormal   Collection Time: 04/15/20  6:40 AM  Result Value Ref Range   Glucose-Capillary 145 (H) 70 - 99 mg/dL    Comment: Glucose reference range applies only to samples taken after fasting for at least 8 hours.  Glucose, capillary     Status: Abnormal   Collection Time: 04/15/20  8:06 AM  Result Value Ref Range   Glucose-Capillary 31 (LL) 70 - 99 mg/dL    Comment: Glucose reference range applies only to samples taken after fasting for at least 8 hours.   Comment 1 Notify RN   Glucose, capillary      Status: Abnormal   Collection Time: 04/15/20  8:07 AM  Result Value Ref Range   Glucose-Capillary 30 (LL) 70 - 99 mg/dL    Comment: Glucose reference range applies only to samples taken after fasting for at least 8 hours.   Comment 1 Notify RN   Glucose, capillary     Status: Abnormal   Collection Time: 04/15/20  8:30 AM  Result Value Ref Range   Glucose-Capillary 193 (H) 70 - 99 mg/dL    Comment: Glucose reference range applies only to samples taken after fasting for at least 8 hours.  Glucose, capillary     Status: Abnormal   Collection Time: 04/15/20  8:30 AM  Result Value Ref Range   Glucose-Capillary 193 (H) 70 - 99 mg/dL    Comment: Glucose reference range applies only to samples taken after fasting for at least 8 hours.  Glucose, capillary     Status: Abnormal   Collection Time: 04/15/20  8:30 AM  Result Value Ref Range   Glucose-Capillary 193 (H) 70 - 99 mg/dL    Comment: Glucose reference range applies only to samples taken after fasting for at least 8 hours.  Glucose, capillary     Status: Abnormal   Collection Time: 04/15/20  8:30 AM  Result Value Ref Range   Glucose-Capillary 193 (H) 70 - 99 mg/dL    Comment: Glucose reference range applies only to samples taken after fasting for at least 8 hours.  Potassium     Status: None   Collection Time: 04/15/20  9:51 AM  Result Value Ref Range   Potassium 3.5 3.5 - 5.1 mmol/L    Comment: Performed at [facility], [address] [place], [place] [address]  Lactic acid, plasma     Status: None   Collection Time: 04/15/20  9:51 AM  Result Value Ref Range   Lactic Acid, Venous 0.9 0.5 - 1.9 mmol/L    Comment: Performed at [facility], [address] [place], [place] [address]  Osmolality     Status: Abnormal   Collection Time: 04/15/20  9:51 AM  Result Value Ref Range   Osmolality 248 (LL) 275 - 295 mOsm/kg    Comment: CRITICAL RESULT CALLED TO, READ BACK BY AND VERIFIED WITH: [facility] MOORE 04/15/20 1144  KB[facility] Performed at [facility], [address] [place], [place] [address]   Cortisol     Status: None   Collection Time: 04/15/20  9:51 AM  Result Value Ref Range   Cortisol, Plasma 26.7 ug/dL    Comment: (NOTE) AM    6.7 - 22.6 ug/dL PM   <29.5       ug/dL Performed at [facility], [address],  [place], [place] [address]   TSH     Status: None   Collection Time: 04/15/20  9:51 AM  Result Value Ref Range   TSH 0.919 0.350 - 4.500 uIU/mL    Comment: Performed by a 3rd Generation assay with a functional sensitivity of <=0.01 uIU/mL. Performed at [facility], [address] [place], [place] [address]   Uric acid     Status: Abnormal   Collection Time: 04/15/20  9:51 AM  Result Value Ref Range   Uric Acid, Serum 9.0 (H) 3.7 - 8.6 mg/dL    Comment: Performed at [facility], [address] [place], [place] [address]  Glucose, capillary     Status: Abnormal   Collection Time: 04/15/20 11:13 AM  Result Value Ref Range   Glucose-Capillary 68 (L) 70 - 99 mg/dL    Comment: Glucose reference range applies only to samples taken after fasting for at least 8 hours.  Glucose, capillary     Status: None   Collection Time: 04/15/20 12:07 PM  Result Value Ref Range   Glucose-Capillary 71 70 - 99 mg/dL    Comment: Glucose reference range applies only to samples taken after fasting for at least 8 hours.  Glucose, capillary     Status: None   Collection Time: 04/15/20 12:41 PM  Result Value Ref Range   Glucose-Capillary 95 70 - 99 mg/dL    Comment: Glucose reference range applies only to samples taken after fasting for at least 8 hours.  Blood gas, arterial     Status: Abnormal   Collection Time: 04/15/20  1:54 PM  Result Value Ref Range   FIO2 1.00    Delivery systems VENTILATOR    Mode PRESSURE REGULATED VOLUME CONTROL    VT 500 mL   LHR 16 resp/min   Peep/cpap 5.0 cm H20   pH, Arterial 7.28 (L) 7.350 - 7.450   pCO2 arterial 49 (H) 32.0 - 48.0  mmHg   pO2, Arterial 250 (H) 83.0 - 108.0 mmHg   Bicarbonate 23.0 20.0 - 28.0 mmol/L   Acid-base deficit 4.0 (H) 0.0 - 2.0 mmol/L   O2 Saturation 99.8 %   Patient temperature 37.0    Collection site LEFT BRACHIAL    Sample type ARTERIAL DRAW    Allens test (pass/fail) PASS PASS   Mechanical Rate 16     Comment: Performed at [facility], [address] [place], [place] [address]  Osmolality, urine     Status: None   Collection Time: 04/15/20  1:55 PM  Result Value Ref Range   Osmolality, Ur 336 300 - 900 mOsm/kg    Comment: Performed at [facility], [address] [place], [place] [address]  Sodium, urine, random     Status: None   Collection Time: 04/15/20  1:55 PM  Result Value Ref Range   Sodium, Ur <10 mmol/L    Comment: Performed at [facility], [address] [place], [place] [address]  Sodium     Status: Abnormal   Collection Time: 04/15/20  2:30 PM  Result Value Ref Range   Sodium 117 (LL) 135 - 145 mmol/L    Comment: CRITICAL RESULT CALLED TO, READ BACK BY AND VERIFIED WITH CARLA PUGH AT 1504 ON 04/15/20 BY SS Performed at [facility], [address] [place], [place] [address]   Glucose, capillary     Status: None   Collection Time: 04/15/20  3:41 PM  Result Value Ref Range   Glucose-Capillary  94 70 - 99 mg/dL    Comment: Glucose reference range applies only to samples taken after fasting for at least 8 hours.    Current Facility-Administered Medications  Medication Dose Route Frequency Provider Last Rate Last Admin  . 0.9 %  sodium chloride infusion  250 mL Intravenous Continuous Elyn AquasBrescia, Donald, MD      . acetaminophen (TYLENOL) tablet 650 mg  650 mg Oral Q4H PRN Eugenie NorrieBlakeney, Dana G, NP   650 mg at 03/30/2020 2244  . albumin human 5 % solution 25 g  25 g Intravenous Once Elyn AquasBrescia, Donald, MD      . Chlorhexidine Gluconate Cloth 2 % PADS 6 each  6 each Topical Q0600 Eugenie NorrieBlakeney, Dana G, NP   6 each at 04/15/20 0539  .  dexmedetomidine (PRECEDEX) 400 MCG/100ML (4 mcg/mL) infusion  0.4-1.2 mcg/kg/hr Intravenous Titrated Elyn AquasBrescia, Donald, MD      . dextrose 10 % 1,000 mL with sodium chloride 154 mEq infusion   Intravenous Continuous Elyn AquasBrescia, Donald, MD 50 mL/hr at 04/15/20 1545 New Bag at 04/15/20 1545  . docusate sodium (COLACE) capsule 100 mg  100 mg Oral BID PRN Eugenie NorrieBlakeney, Dana G, NP      . enoxaparin (LOVENOX) injection 40 mg  40 mg Subcutaneous Q24H Elyn AquasBrescia, Donald, MD      . folic acid (FOLVITE) tablet 1 mg  1 mg Oral Daily Eugenie NorrieBlakeney, Dana G, NP      . HYDROmorphone (DILAUDID) 50 mg in sodium chloride 0.9 % 100 mL (0.5 mg/mL) infusion  1 mg/hr Intravenous Continuous Elyn AquasBrescia, Donald, MD      . HYDROmorphone (DILAUDID) injection 1 mg  1 mg Intravenous Q2H PRN Elyn AquasBrescia, Donald, MD   1 mg at 04/15/20 1342  . [START ON 04/16/2020] influenza vaccine adjuvanted (FLUAD) injection 0.5 mL  0.5 mL Intramuscular Tomorrow-1000 Eugenie NorrieBlakeney, Dana G, NP      . methylPREDNISolone sodium succinate (SOLU-MEDROL) 40 mg/mL injection 40 mg  40 mg Intravenous Q8H Elyn AquasBrescia, Donald, MD   40 mg at 04/15/20 1542  . midazolam (VERSED) 2 MG/2ML injection           . multivitamin with minerals tablet 1 tablet  1 tablet Oral Daily Eugenie NorrieBlakeney, Dana G, NP      . mupirocin ointment (BACTROBAN) 2 % 1 application  1 application Nasal BID Eugenie NorrieBlakeney, Dana G, NP      . norepinephrine (LEVOPHED) 4mg  in 250mL premix infusion  2-10 mcg/min Intravenous Titrated Elyn AquasBrescia, Donald, MD 37.5 mL/hr at 04/15/20 1243 10 mcg/min at 04/15/20 1243  . ondansetron (ZOFRAN) injection 4 mg  4 mg Intravenous Q6H PRN Eugenie NorrieBlakeney, Dana G, NP      . oxyCODONE-acetaminophen (PERCOCET/ROXICET) 5-325 MG per tablet 1-2 tablet  1-2 tablet Oral Q6H PRN Eugenie NorrieBlakeney, Dana G, NP      . polyethylene glycol (MIRALAX / GLYCOLAX) packet 17 g  17 g Oral Daily PRN Eugenie NorrieBlakeney, Dana G, NP      . sodium chloride (hypertonic) 3 % solution   Intravenous Continuous Lateef, Munsoor, MD      . thiamine 500mg  in  normal saline (50ml) IVPB  500 mg Intravenous Q24H Eugenie NorrieBlakeney, Dana G, NP        Musculoskeletal: Strength & Muscle Tone: decreased Gait & Station: unable to stand Patient leans: N/A            Psychiatric Specialty Exam:  Presentation  General Appearance: No data recorded Eye Contact:No data recorded Speech:No data recorded Speech Volume:No data recorded Handedness:No data recorded  Mood  and Affect  Mood:No data recorded Affect:No data recorded  Thought Process  Thought Processes:No data recorded Descriptions of Associations:No data recorded Orientation:No data recorded Thought Content:No data recorded History of Schizophrenia/Schizoaffective disorder:No data recorded Duration of Psychotic Symptoms:No data recorded Hallucinations:No data recorded Ideas of Reference:No data recorded Suicidal Thoughts:No data recorded Homicidal Thoughts:No data recorded  Sensorium  Memory:No data recorded Judgment:No data recorded Insight:No data recorded  Executive Functions  Concentration:No data recorded Attention Span:No data recorded Recall:No data recorded Fund of Knowledge:No data recorded Language:No data recorded  Psychomotor Activity  Psychomotor Activity:No data recorded  Assets  Assets:No data recorded  Sleep  Sleep:No data recorded  Physical Exam: Physical Exam Constitutional:      Appearance: He is ill-appearing.  HENT:     Head: Normocephalic and atraumatic.  Cardiovascular:     Rate and Rhythm: Normal rate and regular rhythm.  Pulmonary:     Effort: Pulmonary effort is normal.     Breath sounds: Normal breath sounds.    Abdominal:     General: Abdomen is flat.     Palpations: Abdomen is soft.  Musculoskeletal:        General: Normal range of motion.  Skin:    General: Skin is warm and dry.  Psychiatric:        Speech: He is noncommunicative.    Review of Systems  Unable to perform ROS: Intubated   Blood pressure (!) 152/70, pulse  (!) 53, temperature (!) 96.2 F (35.7 C), temperature source Axillary, resp. rate 18, height 5\' 11"  (1.803 m), weight 57.3 kg, SpO2 94 %. Body mass index is 17.62 kg/m.  Treatment Plan Summary: Plan Patient is currently on a mechanical ventilator and is on Precedex.  Alcohol level on presentation was negative but circumstances all suggest he had probably been drinking heavily recently.  Puts patient at risk for potentially seizure or delirium tremens over the next 1 to 2 days.  Fortunately being on Precedex is probably the best way to manage delirium tremens.  As far as basic withdrawal and seizures, his sodium is another factor that could put him at elevated risk of a seizure but so far none has been observed.  I do not think I would add any additional medication including not adding any benzodiazepines at this time given that he is already weak and having trouble breathing.  I will continue to follow regularly.  Please contact me if I can be of help.  Disposition: Patient currently intubated no other plan can be formed will follow as needed.  Mordecai Rasmussen, MD 04/15/2020 4:04 PM

## 2020-04-15 NOTE — Progress Notes (Signed)
PHARMACY CONSULT NOTE - FOLLOW UP  Pharmacy Consult for Electrolyte Monitoring and Replacement   Recent Labs: Potassium (mmol/L)  Date Value  04/15/2020 3.5   Magnesium (mg/dL)  Date Value  17/61/6073 2.1   Calcium (mg/dL)  Date Value  71/06/2692 9.1   Albumin (g/dL)  Date Value  85/46/2703 3.9   Phosphorus (mg/dL)  Date Value  50/09/3816 4.6   Sodium (mmol/L)  Date Value  04/15/2020 120 (L)   Assessment: Pt is a 75 y/o M who presented with falls. Pt presented with weakness and fell multiple times and had been more dizzy than usual with difficulty walking. Currently abuses EtOH and unknown amount of alcohol daily. Patient has acute renal failure secondary to ATN and severe hyponatremia likely due to beer potomania. Pharmacy has been consulted to assist with electrolyte management and replacement as indicated.   Creatinine: Scr 1.65>1.5>1.75  Fluids: - Hypertonic saline 3% CI @ 27mL/hr  - D10+NS @ 50 mL/hr  Date    Time    Sodium 0323    2222       Na 111 0323    2326       Na 112 0324    0406       Na 115 0324    1430       Na 117 0324 1835    Na 120   Goal of Therapy:  Electrolytes within normal limits  Plan:  PICC line placed, Nephrology following. - Na & Cl (improving): 3% saline decreased 30>15 mL/hr (goal was 118 for the day, now at 120) and D10+NS @ 50 mL/hr - continue to monitor closely as full effect of titration likely not seen until next lab. - Monitor electrolytes with AM labs   Martyn Malay, [facility] 04/15/2020 10:06 PM

## 2020-04-15 NOTE — Progress Notes (Signed)
PHARMACY CONSULT NOTE - FOLLOW UP  Pharmacy Consult for Electrolyte Monitoring and Replacement   Recent Labs: Potassium (mmol/L)  Date Value  04/15/2020 3.5   Magnesium (mg/dL)  Date Value  53/66/4403 2.1   Calcium (mg/dL)  Date Value  47/42/5956 9.1   Albumin (g/dL)  Date Value  38/75/6433 3.9   Phosphorus (mg/dL)  Date Value  29/51/8841 4.6   Sodium (mmol/L)  Date Value  04/15/2020 117 (LL)   Assessment: Pt is a 75 y/o M who presented with falls. Pt presented with weakness and fell multiple times and had been more dizzy than usual with difficulty walking. Currently abuses EtOH and unknown amount of alcohol daily. Patient has acute renal failure secondary to ATN and severe hyponatremia likely due to beer potomania. Pharmacy has been consulted to assist with electrolyte management and replacement as indicated.   Creatinine: Scr 1.65>1.5>1.75  Fluids: - Hypertonic saline 3% CI @ 25mL/hr  - D10+NS @ 50 mL/hr  Date    Time    Sodium 0323    2222       Na 111 0323    2326       Na 112 0324    0406       Na 115 0324    1430       Na 117   Goal of Therapy:  Electrolytes within normal limits  Plan:  - Na & Cl (improving): Continue 3% saline @ 30 mL/hr and D10+NS @ 50 mL/hr - K 5.6>3.5: Novolog 10 units given @ 0534 - Monitor electrolytes with AM labs   Philis Fendt , Student- PharmD 04/15/2020 3:06 PM

## 2020-04-15 NOTE — Progress Notes (Signed)
PHARMACIST - PHYSICIAN COMMUNICATION  CONCERNING:  Enoxaparin (Lovenox) for DVT Prophylaxis    RECOMMENDATION: Patient was prescribed enoxaprin 40mg  q24 hours for VTE prophylaxis.   Filed Weights   28-Apr-2020 2038 04/15/20 0000 04/15/20 0426  Weight: 59.9 kg (132 lb) 57.3 kg (126 lb 5.2 oz) 57.3 kg (126 lb 5.2 oz)    Body mass index is 17.62 kg/m.  Estimated Creatinine Clearance: 30.5 mL/min (A) (by C-G formula based on SCr of 1.75 mg/dL (H)).  Patient is candidate for enoxaparin 30mg  every 24 hours based on CrCl <40ml/min or Weight <45kg  DESCRIPTION: Pharmacy has adjusted enoxaparin dose per [facility] policy.  Patient is now receiving enoxaparin 30 mg every 24 hours   31m, PharmD Pharmacy Resident  04/15/2020 7:28 AM

## 2020-04-15 NOTE — H&P (Signed)
NAMEWhitaker Arnold, MRN:  [id number], DOB:  1946/01/17, LOS: 1 ADMISSION DATE:  04/20/2020, CONSULTATION DATE: 03/31/2020 REFERRING MD: Dr. Larinda Buttery, CHIEF COMPLAINT: Falls  History of Present Illness:  This is a 75 yo male with a hx of current ETOH Abuse drinks unknown amount of alcohol daily.  He arrived to [facility] ER on 03/23 via EMS from a local motel where he has been residing over the past 2 weeks following multiple falls, and was  subsequently found crawling on the ground by the Armed forces technical officer.  Upon EMS arrival pt noted to have multiple empty beer bottles around his room, although he reported he did not hav e a drink today.  Pt initially refused transport to the ER for treatment, therefore IVC paperwork completed due to concern of pts inability to care for himself although pt not suicidal or homicidal.    ED course Upon arrival to the ER pt reported he had a stroke 3 yrs ago resulting in frequent falls along with "some intermittent trouble" with his right side.  Lab results revealed Na+ 110, chloride 72, CO2 20, glucose 102, BUN 28, creatinine 1.65, anion gap 18, troponin 24, wbc 11.2, and alcohol level <10.  CXR negative for active cardiopulmonary disease, but concerning for artifact vs. nondisplaced fracture of the anterior left fifth rib.  Due to severe hyponatremia ER physician consulted on call Nephrologist Dr. Cherylann Ratel who recommended placing pt on hypertonic 3% saline infusion.  PCCM contacted for ICU admission.    Pertinent  Medical History  Stroke Current ETOH Abuse  Tobacco Abuse   Significant Hospital Events: Including procedures, antibiotic start and stop dates in addition to other pertinent events   03/23: Pt admitted to ICU with severe hyponatremia requiring 3% hypertonic saline infusion   Interim History / Subjective:  Pt denies suicidal or homicidal ideation  Objective   Blood pressure (!) 96/48, pulse 75, temperature (!) 96.2 F (35.7 C), temperature source Axillary, resp.  rate (!) 21, height 5\' 11"  (1.803 m), weight 57.3 kg, SpO2 93 %.        Intake/Output Summary (Last 24 hours) at 04/15/2020 04/17/2020 Last data filed at 04/15/2020 0600 Gross per 24 hour  Intake 200.95 ml  Output 150 ml  Net 50.95 ml   Filed Weights   04/04/2020 2038 04/15/20 0000 04/15/20 0426  Weight: 59.9 kg 57.3 kg 57.3 kg    Examination: General: acutely ill disheveled frail male, resting in bed NAD HENT: supple, no JVD  Lungs: clear throughout, even, non labored  Cardiovascular: nsr, rrr, no R/G, 2+ radial/2+ distal pulses  Abdomen: +BS x4, soft, non tender, non distended  Extremities: normal bulk and tone, moves all extremities  Skin: forehead abrasion, posterior scalp abrasion  Neuro: alert and oriented, follows commands, PERRLA GU: voiding via urinal   Labs/imaging that I havepersonally reviewed   03/23: Na+ 110, Chloride 72, CO2 20, glucose 102, BUN 28, creatinine 1.65, anion gap 18, troponin 24, wbc 11.2, alcohol level <10, and EKG normal sinus rhythm no signs of ischemia  03/23: CXR negative for active cardiopulmonary disease, but concerning for artifact vs. nondisplaced fracture of the anterior left fifth rib 03/23: CT Head Cervical Spine revealed no acute cervical spine fracture. Multilevel spondylosis and facet hypertrophy greatest from C4-5 through C6-7.  Resolved Hospital Problem list   N/A  Assessment & Plan:   Acute Hypoxic Respiratory Failure Patient required intubation for altered mental status leading to poor pulmonary toilet Intubation required post induction pressor support ETT/MV  per protocol   Severe hyponatremia likely secondary to beer potomania  Continuous telemetry monitoring  Nephrology consulted appreciate input-continue 3% hypertonic saline per recommendations  Continue serial sodiums  Random urine sodium, UA, serum osmolality, and urine osmolality results pending  Mildly elevated troponin likely secondary to demand ischemia in the setting of  severe hyponatremia  Trend troponin's  Acute renal failure with anion gap metabolic acidosis secondary to ATN Hypochloridemia  Trend BMP  Replace electrolytes as indicated  Monitor UOP  Avoid nephrotoxic medications   Mild leukocytosis with no s/sx of infection  Trend WBC and monitor fever curve  No indication for abx at this time  Protein malnutrition Will consult dietitian appreciate input   Acute metabolic encephalopathy-CT Head negative for acute intracranial process   Frequent falls Hx: Current everyday ETOH abuse and tobacco abuse  Continue CIWA protocol  Will continue high dose thiamine  Urine drug screen pending ETOH abuse and smoking cessation counseling provided Maintain fall precautions  Will consult transition care team; pt also homeless Pt involuntarily committed will consult psychiatry   Best practice (evaluated daily)  Diet:  Oral Pain/Anxiety/Delirium protocol (if indicated): No VAP protocol (if indicated): Not indicated DVT prophylaxis: LMWH GI prophylaxis: N/A Glucose control:  SSI No Central venous access:  N/A Arterial line:  N/A Foley:  N/A Mobility:  bed rest  PT consulted: N/A Last date of multidisciplinary goals of care discussion [N/A] Code Status:  full code Disposition: ICU  Labs   CBC: Recent Labs  Lab 04/22/2020 2039 04/06/2020 2222 04/15/20 0406  WBC 11.2* 10.0 11.0*  NEUTROABS 9.4*  --   --   HGB 13.5 13.6 13.5  HCT RESULTS UNAVAILABLE DUE TO INTERFERING SUBSTANCE RESULTS UNAVAILABLE DUE TO INTERFERING SUBSTANCE RESULTS UNAVAILABLE DUE TO INTERFERING SUBSTANCE  MCV RESULTS UNAVAILABLE DUE TO INTERFERING SUBSTANCE RESULTS UNAVAILABLE DUE TO INTERFERING SUBSTANCE RESULTS UNAVAILABLE DUE TO INTERFERING SUBSTANCE  PLT 230 220 218    Basic Metabolic Panel: Recent Labs  Lab 04/18/2020 2039 04/06/2020 2222 04/20/2020 2326 04/15/20 0406  NA 110* 111* 112* 115*  K 4.6  --   --  5.6*  CL 72*  --   --  77*  CO2 20*  --   --  24  GLUCOSE  102*  --   --  80  BUN 28*  --   --  32*  CREATININE 1.65* 1.50*  --  1.75*  CALCIUM 8.9  --   --  9.1  MG  --  2.1  --  2.1  PHOS  --  3.9  --  4.6   GFR: Estimated Creatinine Clearance: 30.5 mL/min (A) (by C-G formula based on SCr of 1.75 mg/dL (H)). Recent Labs  Lab 04/01/2020 2039 04/16/2020 2222 04/15/20 0406 04/15/20 0622  WBC 11.2* 10.0 11.0*  --   LATICACIDVEN  --   --   --  1.8    Liver Function Tests: Recent Labs  Lab 03/23/2020 2039 04/15/20 0406  AST 75* 84*  ALT 30 29  ALKPHOS 99 100  BILITOT 1.3* 1.2  PROT 7.0 6.8  ALBUMIN 3.9 3.9   No results for input(s): LIPASE, AMYLASE in the last 168 hours. No results for input(s): AMMONIA in the last 168 hours.  ABG No results found for: PHART, PCO2ART, PO2ART, HCO3, TCO2, ACIDBASEDEF, O2SAT   Coagulation Profile: No results for input(s): INR, PROTIME in the last 168 hours.  Cardiac Enzymes: No results for input(s): CKTOTAL, CKMB, CKMBINDEX, TROPONINI in the last 168 hours.  HbA1C: No results found for: HGBA1C  CBG: Recent Labs  Lab 04/15/20 0618 04/15/20 0640 04/15/20 0806 04/15/20 0807 04/15/20 0830  GLUCAP 55* 145* 31* 30* 193*  193*  193*  193*    Review of Systems: Positives in BOLD   Gen: falls, fever, chills, weight change, fatigue, night sweats HEENT: Denies blurred vision, double vision, hearing loss, tinnitus, sinus congestion, rhinorrhea, sore throat, neck stiffness, dysphagia PULM: Denies shortness of breath, cough, sputum production, hemoptysis, wheezing CV: Denies chest pain, edema, orthopnea, paroxysmal nocturnal dyspnea, palpitations GI: Denies abdominal pain, nausea, vomiting, diarrhea, hematochezia, melena, constipation, change in bowel habits GU: Denies dysuria, hematuria, polyuria, oliguria, urethral discharge Endocrine: Denies hot or cold intolerance, polyuria, polyphagia or appetite change Derm: Denies rash, dry skin, scaling or peeling skin change Heme: Denies easy bruising,  bleeding, bleeding gums Neuro: Denies headache, numbness, weakness, slurred speech, loss of memory or consciousness  Past Medical History:  He,  has a past medical history of Stroke (HCC).   Surgical History:   Past Surgical History:  Procedure Laterality Date  . HERNIA REPAIR       Social History:   reports that he has been smoking cigarettes. He has been smoking about 1.00 pack per day. He has never used smokeless tobacco. He reports current alcohol use. He reports previous drug use.   Family History:  His family history is not on file.   Allergies No Known Allergies   Home Medications  Prior to Admission medications   Not on File     Critical care time: 45 minutes      Sonda Rumble, [facility]  Pulmonary/Critical Care Pager [phone] (please enter 7 digits) PCCM Consult Pager [phone] (please enter 7 digits)

## 2020-04-15 NOTE — Progress Notes (Deleted)
PHARMACY CONSULT NOTE - FOLLOW UP  Pharmacy Consult for Electrolyte Monitoring and Replacement   Recent Labs: Potassium (mmol/L)  Date Value  04/15/2020 5.6 (H)   Magnesium (mg/dL)  Date Value  97/98/9211 2.1   Calcium (mg/dL)  Date Value  94/17/4081 9.1   Albumin (g/dL)  Date Value  44/81/8563 3.9   Phosphorus (mg/dL)  Date Value  14/97/0263 4.6   Sodium (mmol/L)  Date Value  04/15/2020 115 (LL)   Pt is a 75 y/o M who presented with falls. Pt presented with weakness and fell multiple times and had been more dizzy than usual with difficulty walking. Currently abuses EtOH and unknown amount of alcohol daily. Patient has acute renal failure secondary to ATN and severe hyponatremia likely due to beer potomania. Pharmacy has been consulted to assist with electrolyte management and replacement as indicated.   Assessment: Hypertonic saline 3% CI @ 30mL/hr  Starting D10+NS @ 50 mL/hr 3/24 Na: 111 > 112 > 115  K: 4.6 > 5.6 > 3.5  ----Novolog 10U given 0600 3/24 SCr: 1.5 > 1.75     Goal of Therapy:  Electrolytes within normal limits  Plan:  - Na & Cl: Continue 3% saline @ 30 mL/hr    - Monitor electrolytes with AM labs   Philis Fendt , Student- PharmD 04/15/2020 8:00 AM

## 2020-04-16 DIAGNOSIS — F1010 Alcohol abuse, uncomplicated: Secondary | ICD-10-CM

## 2020-04-16 DIAGNOSIS — I952 Hypotension due to drugs: Secondary | ICD-10-CM

## 2020-04-16 DIAGNOSIS — E43 Unspecified severe protein-calorie malnutrition: Secondary | ICD-10-CM | POA: Insufficient documentation

## 2020-04-16 LAB — RENAL FUNCTION PANEL
Albumin: 3.5 g/dL (ref 3.5–5.0)
Anion gap: 7 (ref 5–15)
BUN: 25 mg/dL — ABNORMAL HIGH (ref 8–23)
CO2: 23 mmol/L (ref 22–32)
Calcium: 8 mg/dL — ABNORMAL LOW (ref 8.9–10.3)
Chloride: 93 mmol/L — ABNORMAL LOW (ref 98–111)
Creatinine, Ser: 1.09 mg/dL (ref 0.61–1.24)
GFR, Estimated: >60 mL/min (ref >60)
Glucose, Bld: 208 mg/dL — ABNORMAL HIGH (ref 70–99)
Phosphorus: 3.5 mg/dL (ref 2.5–4.6)
Potassium: 4.1 mmol/L (ref 3.5–5.1)
Sodium: 123 mmol/L — ABNORMAL LOW (ref 135–145)

## 2020-04-16 LAB — GLUCOSE, CAPILLARY
Glucose-Capillary: 31 mg/dL — CL (ref 70–99)
Glucose-Capillary: 224 mg/dL — ABNORMAL HIGH (ref 70–99)
Glucose-Capillary: 200 mg/dL — ABNORMAL HIGH (ref 70–99)
Glucose-Capillary: 179 mg/dL — ABNORMAL HIGH (ref 70–99)
Glucose-Capillary: 136 mg/dL — ABNORMAL HIGH (ref 70–99)
Glucose-Capillary: 127 mg/dL — ABNORMAL HIGH (ref 70–99)
Glucose-Capillary: 158 mg/dL — ABNORMAL HIGH (ref 70–99)
Glucose-Capillary: 137 mg/dL — ABNORMAL HIGH (ref 70–99)

## 2020-04-16 LAB — CBC
HCT: 27.7 % — ABNORMAL LOW (ref 39.0–52.0)
Hemoglobin: 10.1 g/dL — ABNORMAL LOW (ref 13.0–17.0)
MCH: 33 pg (ref 26.0–34.0)
MCHC: 36.5 g/dL — ABNORMAL HIGH (ref 30.0–36.0)
MCV: 90.5 fL (ref 80.0–100.0)
Platelets: 152 10*3/uL (ref 150–400)
RBC: 3.06 MIL/uL — ABNORMAL LOW (ref 4.22–5.81)
RDW: 12.7 % (ref 11.5–15.5)
WBC: 6.9 10*3/uL (ref 4.0–10.5)
nRBC: 0 % (ref 0.0–0.2)

## 2020-04-16 LAB — SODIUM
Sodium: 119 mmol/L — CL (ref 135–145)
Sodium: 123 mmol/L — ABNORMAL LOW (ref 135–145)
Sodium: 123 mmol/L — ABNORMAL LOW (ref 135–145)
Sodium: 125 mmol/L — ABNORMAL LOW (ref 135–145)
Sodium: 128 mmol/L — ABNORMAL LOW (ref 135–145)

## 2020-04-16 LAB — MAGNESIUM: Magnesium: 1.8 mg/dL (ref 1.7–2.4)

## 2020-04-16 MED ORDER — MIDAZOLAM HCL 2 MG/2ML IJ SOLN
1.0000 mg | INTRAMUSCULAR | Status: DC | PRN
  Administered 2020-04-16 – 2020-04-19 (×10): 1 mg via INTRAVENOUS
  Filled 2020-04-16 (×10): qty 2

## 2020-04-16 MED ORDER — FREE WATER
30.0000 mL | Status: DC
  Administered 2020-04-16 – 2020-04-23 (×42): 30 mL

## 2020-04-16 MED ORDER — MIDAZOLAM HCL 2 MG/2ML IJ SOLN
1.0000 mg | INTRAMUSCULAR | Status: AC | PRN
  Administered 2020-04-16 – 2020-04-17 (×3): 1 mg via INTRAVENOUS
  Filled 2020-04-16 (×3): qty 2

## 2020-04-16 MED ORDER — ADULT MULTIVITAMIN W/MINERALS CH
1.0000 | ORAL_TABLET | Freq: Every day | ORAL | Status: DC
  Administered 2020-04-16 – 2020-04-22 (×7): 1
  Filled 2020-04-16 (×7): qty 1

## 2020-04-16 MED ORDER — SODIUM CHLORIDE 3 % IV SOLN
INTRAVENOUS | Status: DC
  Filled 2020-04-16 (×2): qty 500

## 2020-04-16 MED ORDER — MIDAZOLAM HCL 2 MG/2ML IJ SOLN
2.0000 mg | Freq: Once | INTRAMUSCULAR | Status: AC
  Administered 2020-04-16: 2 mg via INTRAVENOUS

## 2020-04-16 MED ORDER — MIDAZOLAM HCL 2 MG/2ML IJ SOLN
INTRAMUSCULAR | Status: AC
  Filled 2020-04-16: qty 2

## 2020-04-16 MED ORDER — MAGNESIUM SULFATE 2 GM/50ML IV SOLN
2.0000 g | Freq: Once | INTRAVENOUS | Status: AC
  Administered 2020-04-16: 2 g via INTRAVENOUS
  Filled 2020-04-16: qty 50

## 2020-04-16 MED ORDER — PROSOURCE TF PO LIQD
45.0000 mL | Freq: Every day | ORAL | Status: DC
  Administered 2020-04-17 – 2020-04-22 (×6): 45 mL
  Filled 2020-04-16 (×7): qty 45

## 2020-04-16 MED ORDER — DOCUSATE SODIUM 50 MG/5ML PO LIQD
100.0000 mg | Freq: Two times a day (BID) | ORAL | Status: DC
  Administered 2020-04-16 – 2020-04-21 (×12): 100 mg
  Filled 2020-04-16 (×12): qty 10

## 2020-04-16 MED ORDER — VITAL AF 1.2 CAL PO LIQD
1000.0000 mL | ORAL | Status: DC
  Administered 2020-04-16 – 2020-04-22 (×5): 1000 mL

## 2020-04-16 MED ORDER — INSULIN ASPART 100 UNIT/ML ~~LOC~~ SOLN
0.0000 [IU] | SUBCUTANEOUS | Status: DC
  Administered 2020-04-16: 2 [IU] via SUBCUTANEOUS
  Administered 2020-04-16 – 2020-04-17 (×4): 1 [IU] via SUBCUTANEOUS
  Administered 2020-04-17: 2 [IU] via SUBCUTANEOUS
  Administered 2020-04-17: 1 [IU] via SUBCUTANEOUS
  Administered 2020-04-18 (×3): 2 [IU] via SUBCUTANEOUS
  Administered 2020-04-18 (×2): 1 [IU] via SUBCUTANEOUS
  Filled 2020-04-16 (×10): qty 1

## 2020-04-16 MED ORDER — FOLIC ACID 1 MG PO TABS
1.0000 mg | ORAL_TABLET | Freq: Every day | ORAL | Status: DC
  Administered 2020-04-16 – 2020-04-22 (×7): 1 mg
  Filled 2020-04-16 (×7): qty 1

## 2020-04-16 MED ORDER — DOPAMINE-DEXTROSE 3.2-5 MG/ML-% IV SOLN
2.5000 ug/kg/min | INTRAVENOUS | Status: DC
  Administered 2020-04-16: 2.5 ug/kg/min via INTRAVENOUS
  Filled 2020-04-16 (×2): qty 250

## 2020-04-16 MED ORDER — POLYETHYLENE GLYCOL 3350 17 G PO PACK
17.0000 g | PACK | Freq: Every day | ORAL | Status: DC
  Administered 2020-04-16 – 2020-04-21 (×6): 17 g
  Filled 2020-04-16 (×6): qty 1

## 2020-04-16 NOTE — Consult Note (Signed)
Came by to see patient again today but he is still ventilated.  I will check in and follow-up if still needed.

## 2020-04-16 NOTE — Progress Notes (Signed)
Initial Nutrition Assessment  DOCUMENTATION CODES:   Severe malnutrition in context of social or environmental circumstances  INTERVENTION:   Vital 1.2 @50ml /hr- Initiate at 92ml/hr and increase by 78ml/hr  q 8 hours until goal rate is reached.   Pro-Source 31ml daily via tube, provides 40kcal and 11g of protein per serving   Free water flushes 41ml q4 hours to maintain tube patency   Regimen provides 1480kcal/day, 101g/day protein and 1132ml/day  Pt at high refeed risk; recommend monitor potassium, magnesium and phosphorus labs daily until stable  NUTRITION DIAGNOSIS:   Severe Malnutrition related to social / environmental circumstances (homeless, etoh abuse) as evidenced by severe fat depletion,severe muscle depletion.  GOAL:   Provide needs based on ASPEN/SCCM guidelines  MONITOR:   Labs,Weight trends,Skin,I & O's,Vent status,TF tolerance  REASON FOR ASSESSMENT:   Ventilator    ASSESSMENT:   75 y.o. male with a PMHx of prior history of CVA, alcohol abuse, tobacco abuse and homelessness who was admitted to [facility] on [facility]/02/2020 for AMS   Pt sedated and ventilated. OGT in place. Suspect pt with poor appetite and oral intake at baseline r/t etoh abuse as pt with severe malnutrition. Plan is to start tube feeds today. Pt is at high refeed risk.   There is no documented weight history to determine if any significant weight changes.   Medications reviewed and include: colace, lovenox, folic acid, insulin, solu-medrol, MVI, miralax, precedex, dilaudid, Mg sulfate, levophed, thiamine   Labs reviewed: Na 123(L), BUN 25(H), K 4.1 wnl, P 3.5 wnl, Mg 1.8 Hgb 10.1(L), Hct 27.7(L) cbgs- 224, 200, 179, 136 x 24 hrs  Patient is currently intubated on ventilator support MV: 9.1 L/min Temp (24hrs), Avg:97.2 F (36.2 C), Min:97 F (36.1 C), Max:97.5 F (36.4 C)  MAP- >48mmHg  UOP- 77m  NUTRITION - FOCUSED PHYSICAL EXAM:  Flowsheet Row Most Recent Value  Orbital Region  Severe depletion  Upper Arm Region Severe depletion  Thoracic and Lumbar Region Severe depletion  Buccal Region Severe depletion  Temple Region Severe depletion  Clavicle Bone Region Severe depletion  Clavicle and Acromion Bone Region Severe depletion  Scapular Bone Region Severe depletion  Dorsal Hand Severe depletion  Patellar Region Severe depletion  Anterior Thigh Region Severe depletion  Posterior Calf Region Severe depletion  Edema (RD Assessment) None  Hair Reviewed  Eyes Reviewed  Mouth Reviewed  Skin Reviewed  Nails Reviewed     Diet Order:   Diet Order    None     EDUCATION NEEDS:   No education needs have been identified at this time  Skin:  Skin Assessment: Reviewed RN Assessment (ecchymosis)  Last BM:  pta  Height:   Ht Readings from Last 1 Encounters:  04/15/20 5\' 11"  (1.803 m)    Weight:   Wt Readings from Last 1 Encounters:  04/15/20 57.3 kg    Ideal Body Weight:  78.18 kg  BMI:  Body mass index is 17.62 kg/m.  Estimated Nutritional Needs:   Kcal:  1440kcal/day  Protein:  90-105g/day  Fluid:  1.5.1.7L/day  MS, RD, LDN Please refer to [facility] for RD and/or RD on-call/weekend/after hours pager

## 2020-04-16 NOTE — Progress Notes (Addendum)
PHARMACY CONSULT NOTE - FOLLOW UP  Pharmacy Consult for Electrolyte Monitoring and Replacement   Recent Labs: Potassium (mmol/L)  Date Value  04/16/2020 4.1   Magnesium (mg/dL)  Date Value  51/88/4166 1.8   Calcium (mg/dL)  Date Value  07/23/1599 8.0 (L)   Albumin (g/dL)  Date Value  09/32/3557 3.5   Phosphorus (mg/dL)  Date Value  32/20/2542 3.5   Sodium (mmol/L)  Date Value  04/16/2020 123 (L)   Assessment: Pt is a 75 y/o M who presented with falls. Pt presented with weakness and dizziness. Pt had fallen multiple times. PMH includes CVA and alcohol abuse. Patient has acute renal failure secondary to ATN and severe hyponatremia likely due to beer potomania. Pharmacy has been consulted to assist with electrolyte management and replacement as indicated.   Creatinine: Scr 1.65>1.5>1.75>1.09  Fluids: - Hypertonic saline 3% CI @ 11mL/hr  -- Stopped 3/24 @ 2306 -- Restarted 3/25 @ 1200 - 3/24 D10+NS @ 50 mL/hr >> 3/25  Date    Time    Sodium 0323    2222       Na 111 0323    2326       Na 112 0324    0406       Na 115 0324    1430       Na 117 0324 1835    Na 120 0324 2353    Na 119 0325 0420    Na 123 0325 0958    Na 136  Nutrition: Tube feeds @ 50 mL/hr restarted 3/25. Prosource feeds daily restarting 3/26   Goal of Therapy:  Electrolytes within normal limits  Plan:  - Na 136: --- 3% saline stopped 3/24 @ 2306, scheduled to restart 3/25 @ 1200 --- 10+NS @ 50 mL/hr discontinued - Continue to monitor Na levels q4h  - Mg 1.8 - ordered Mg 2g IV x 1 dose - Monitor electrolytes with AM labs   Reatha Armour, PharmD Pharmacy Resident  04/16/2020 7:20 AM

## 2020-04-16 NOTE — Progress Notes (Signed)
[facility]  ROUNDING NOTE   Subjective:   Patient seen and evaluated at bedside. Not critically ill. Has been intubated. Serum sodium up to 123. 3% saline was stopped. Discussed with pharmacy and we have decided to restart later this AM at 20 cc/h.  Objective:  Vital signs in last 24 hours:  Temp:  [96.5 F (35.8 C)-97.5 F (36.4 C)] 97.5 F (36.4 C) (03/25 0800) Pulse Rate:  [28-83] 32 (03/25 0800) Resp:  [0-21] 18 (03/25 0800) BP: (85-173)/(53-80) 127/61 (03/25 0800) SpO2:  [75 %-100 %] 98 % (03/25 0800) FiO2 (%):  [70 %-100 %] 70 % (03/25 0744)  Weight change:  Filed Weights   04/02/2020 2038 04/15/20 0000 04/15/20 0426  Weight: 59.9 kg 57.3 kg 57.3 kg    Intake/Output: I/O last 3 completed shifts: In: 555.5 [I.V.:555.5] Out: 1200 [Urine:1200]   Intake/Output this shift:  No intake/output data recorded.  Physical Exam: General:  Critically ill-appearing  Head:  Endotracheal tube in place  Eyes:  Anicteric  Neck:  Supple  Lungs:   Scattered rhonchi, vent assisted  Heart:  S1S2 no rubs  Abdomen:   Soft, nontender, bowel sounds present  Extremities:  No peripheral edema.  Neurologic:  Intubated, not following commands  Skin:  No acute rash.  Access:  No dialysis access    Basic Metabolic Panel: Recent Labs  Lab 04/05/2020 2039 03/29/2020 2222 04/01/2020 2326 04/15/20 0406 04/15/20 0951 04/15/20 1430 04/15/20 1835 04/15/20 2345 04/16/20 0420  NA 110* 111*   < > 115*  --  117* 120* 119* 123*  K 4.6  --   --  5.6* 3.5  --   --   --  4.1  CL 72*  --   --  77*  --   --   --   --  93*  CO2 20*  --   --  24  --   --   --   --  23  GLUCOSE 102*  --   --  80  --   --   --   --  208*  BUN 28*  --   --  32*  --   --   --   --  25*  CREATININE 1.65* 1.50*  --  1.75*  --   --   --   --  1.09  CALCIUM 8.9  --   --  9.1  --   --   --   --  8.0*  MG  --  2.1  --  2.1  --   --   --   --  1.8  PHOS  --  3.9  --  4.6  --   --   --   --  3.5   < > = values  in this interval not displayed.    Liver Function Tests: Recent Labs  Lab 03/31/2020 2039 04/15/20 0406 04/16/20 0420  AST 75* 84*  --   ALT 30 29  --   ALKPHOS 99 100  --   BILITOT 1.3* 1.2  --   PROT 7.0 6.8  --   ALBUMIN 3.9 3.9 3.5   No results for input(s): LIPASE, AMYLASE in the last 168 hours. No results for input(s): AMMONIA in the last 168 hours.  CBC: Recent Labs  Lab 04/17/2020 2039 04/01/2020 2222 04/15/20 0406 04/16/20 0420  WBC 11.2* 10.0 11.0* 6.9  NEUTROABS 9.4*  --   --   --   HGB 13.5 13.6  13.5 10.1*  HCT RESULTS UNAVAILABLE DUE TO INTERFERING SUBSTANCE RESULTS UNAVAILABLE DUE TO INTERFERING SUBSTANCE RESULTS UNAVAILABLE DUE TO INTERFERING SUBSTANCE 27.7*  MCV RESULTS UNAVAILABLE DUE TO INTERFERING SUBSTANCE RESULTS UNAVAILABLE DUE TO INTERFERING SUBSTANCE RESULTS UNAVAILABLE DUE TO INTERFERING SUBSTANCE 90.5  PLT 230 220 218 152    Cardiac Enzymes: No results for input(s): CKTOTAL, CKMB, CKMBINDEX, TROPONINI in the last 168 hours.  BNP: Invalid input(s): POCBNP  CBG: Recent Labs  Lab 04/15/20 1921 04/15/20 2353 04/16/20 0410 04/16/20 0625 04/16/20 0723  GLUCAP 113* 185* 224* 200* 179*    Microbiology: Results for orders placed or performed during the hospital encounter of 04/01/2020  Resp Panel by RT-PCR (Flu A&B, Covid) Nasopharyngeal Swab     Status: None   Collection Time: 04/09/2020 10:22 PM   Specimen: Nasopharyngeal Swab; Nasopharyngeal(NP) swabs in vial transport medium  Result Value Ref Range Status   SARS Coronavirus 2 by RT PCR NEGATIVE NEGATIVE Final    Comment: (NOTE) SARS-CoV-2 target nucleic acids are NOT DETECTED.  The SARS-CoV-2 RNA is generally detectable in upper respiratory specimens during the acute phase of infection. The lowest concentration of SARS-CoV-2 viral copies this assay can detect is 138 copies/mL. A negative result does not preclude SARS-Cov-2 infection and should not be used as the sole basis for treatment  or other patient management decisions. A negative result may occur with  improper specimen collection/handling, submission of specimen other than nasopharyngeal swab, presence of viral mutation(s) within the areas targeted by this assay, and inadequate number of viral copies(<138 copies/mL). A negative result must be combined with clinical observations, patient history, and epidemiological information. The expected result is Negative.  Fact Sheet for Patients:  BloggerCourse.com  Fact Sheet for Healthcare Providers:  SeriousBroker.it  This test is no t yet approved or cleared by the [place] FDA and  has been authorized for detection and/or diagnosis of SARS-CoV-2 by FDA under an Emergency Use Authorization (EUA). This EUA will remain  in effect (meaning this test can be used) for the duration of the COVID-19 declaration under Section 564(b)(1) of the Act, 21 U.S.C.section 360bbb-3(b)(1), unless the authorization is terminated  or revoked sooner.       Influenza A by PCR NEGATIVE NEGATIVE Final   Influenza B by PCR NEGATIVE NEGATIVE Final    Comment: (NOTE) The Xpert Xpress SARS-CoV-2/FLU/RSV plus assay is intended as an aid in the diagnosis of influenza from Nasopharyngeal swab specimens and should not be used as a sole basis for treatment. Nasal washings and aspirates are unacceptable for Xpert Xpress SARS-CoV-2/FLU/RSV testing.  Fact Sheet for Patients: BloggerCourse.com  Fact Sheet for Healthcare Providers: SeriousBroker.it  This test is not yet approved or cleared by the [place] FDA and has been authorized for detection and/or diagnosis of SARS-CoV-2 by FDA under an Emergency Use Authorization (EUA). This EUA will remain in effect (meaning this test can be used) for the duration of the COVID-19 declaration under Section 564(b)(1) of the Act, 21 U.S.C. section  360bbb-3(b)(1), unless the authorization is terminated or revoked.  Performed at [facility], [address] [place], [place] [address]   MRSA PCR Screening     Status: Abnormal   Collection Time: 03/29/2020 11:35 PM   Specimen: Nasal Mucosa; Nasopharyngeal  Result Value Ref Range Status   MRSA by PCR POSITIVE (A) NEGATIVE Final    Comment:        The GeneXpert MRSA Assay (FDA approved for NASAL specimens only), is one component of  a comprehensive MRSA colonization surveillance program. It is not intended to diagnose MRSA infection nor to guide or monitor treatment for MRSA infections. RESULT CALLED TO, READ BACK BY AND VERIFIED WITH: CINTHIA VEZQUEZ @0400  04/15/20 RH Performed at [facility], [address] [place], [place] [address]     Coagulation Studies: No results for input(s): LABPROT, INR in the last 72 hours.  Urinalysis: Recent Labs    Jun 21, 2020 2043  COLORURINE YELLOW*  LABSPEC 1.006  PHURINE 6.0  GLUCOSEU NEGATIVE  HGBUR MODERATE*  BILIRUBINUR NEGATIVE  KETONESUR 5*  PROTEINUR 30*  NITRITE NEGATIVE  LEUKOCYTESUR NEGATIVE      Imaging: DG Chest 2 View  Result Date: Jan 23, 2021 CLINICAL DATA:  75 year old male with weakness.  Fall. EXAM: CHEST - 2 VIEW COMPARISON:  Chest radiograph dated 11/22/2009. FINDINGS: Background of mild emphysema and chronic bronchitic changes. No focal consolidation, pleural effusion or pneumothorax. The cardiac silhouette is within limits. Osteopenia with mild degenerative changes of the spine. Apparent focal area of cortical irregularity of the anterior left fifth rib may be artifactual or represent a nondisplaced fracture. Correlation with point tenderness recommended. IMPRESSION: 1. No active cardiopulmonary disease. 2. Artifact versus a nondisplaced fracture of the anterior left fifth rib. Electronically Signed   By: Elgie CollardArash  Radparvar M.D.   On: 0Jan 01, 2023 21:19   CT Head Wo Contrast  Result Date:  Jan 23, 2021 CLINICAL DATA:  Larey SeatFell, facial abrasions EXAM: CT HEAD WITHOUT CONTRAST TECHNIQUE: Contiguous axial images were obtained from the base of the skull through the vertex without intravenous contrast. COMPARISON:  11/22/2009 FINDINGS: Brain: Hypodensities in the bilateral frontal periventricular white matter consistent with chronic small vessel ischemic changes. Evidence of prior right frontal burr hole with minimal right frontal cortical encephalomalacia. No signs of acute infarct or hemorrhage. Lateral ventricles and midline structures are unremarkable. No acute extra-axial fluid collections. No mass effect. Vascular: No hyperdense vessel or unexpected calcification. Skull: Prior right frontal burr hole. Remainder of the calvarium is unremarkable. Sinuses/Orbits: Diffuse mucoperiosteal thickening throughout the ethmoid and maxillary sinuses. Other: None. IMPRESSION: 1. No acute intracranial process. 2. Chronic small vessel ischemic changes in the bilateral frontal white matter. Electronically Signed   By: Sharlet SalinaMichael  Brown M.D.   On: 0Jan 01, 2023 21:13   CT Cervical Spine Wo Contrast  Result Date: Jan 23, 2021 CLINICAL DATA:  Larey SeatFell, facial abrasions EXAM: CT CERVICAL SPINE WITHOUT CONTRAST TECHNIQUE: Multidetector CT imaging of the cervical spine was performed without intravenous contrast. Multiplanar CT image reconstructions were also generated. COMPARISON:  None. FINDINGS: Alignment: Alignment is grossly anatomic. Skull base and vertebrae: No acute fracture. No primary bone lesion or focal pathologic process. Soft tissues and spinal canal: No prevertebral fluid or swelling. No visible canal hematoma. Extensive atherosclerosis at the carotid bifurcations. Disc levels: Prominent spondylosis at C4-5, C5-6, and C6-7, with associated facet hypertrophy. There is bony fusion across the C4-5 facet joints. There is symmetrical neural foraminal narrowing at C4-5, C5-6, and C6-7. Upper chest: Airway is patent. Mild  emphysematous changes at the lung apices. Other: Reconstructed images demonstrate no additional findings. IMPRESSION: 1. No acute cervical spine fracture. 2. Multilevel spondylosis and facet hypertrophy greatest from C4-5 through C6-7. Electronically Signed   By: Sharlet SalinaMichael  Brown M.D.   On: 0Jan 01, 2023 21:16   DG Chest Left Decubitus  Result Date: 04/15/2020 CLINICAL DATA:  Fall.  Evaluate for right pneumothorax. EXAM: CHEST - LEFT DECUBITUS COMPARISON:  04/15/2020 FINDINGS: Endotracheal tube tip remains at the level of clavicles approximately 10 cm above the carina. Nasogastric  tube is seen entering the stomach. No right-sided pneumothorax or hemothorax visualized. Mild streaky opacity in the medial right lower lung shows no significant change. No new or worsening areas of pulmonary opacity are seen. Pulmonary hyperinflation again seen. Subacute or chronic right lower lateral rib fracture deformities are again seen. Subacute or chronic fracture deformity of the right lateral 5th rib is also seen. IMPRESSION: Several subacute versus chronic right rib fracture deformities. No evidence of pneumothorax or hemothorax. Stable mild infiltrate in medial right lower lung. High endotracheal tube position again seen. Electronically Signed   By: Danae Orleans M.D.   On: 04/15/2020 16:05   DG Chest Port 1 View  Result Date: 04/15/2020 CLINICAL DATA:  OG tube placement and post intubation. EXAM: PORTABLE CHEST 1 VIEW COMPARISON:  April 14, 2020 FINDINGS: Interval placement of an endotracheal tube, tip in the proximal trachea approximately 6.5 cm above the carina. Gastric tube courses through the field of view into the upper abdomen. Subtle RIGHT basilar opacity has developed since the previous exam. No lobar consolidation. No sign of pleural effusion on frontal radiograph. Regional skeletal structures show no acute process. EKG leads project over the chest. IMPRESSION: 1. Interval placement of an endotracheal tube, tip in  the proximal trachea approximately 6.5 cm above the carina. 2. Subtle RIGHT basilar opacity has developed since the previous exam, correlate with any risk for aspiration. 3. Lucency beneath the RIGHT hemidiaphragm not as pronounced as on the previous image. Again this finding may be related to lung projecting behind the diaphragm. See abdominal radiograph the same date for further detail. Electronically Signed   By: Donzetta Kohut M.D.   On: 04/15/2020 13:49   DG Abd Decub  Result Date: 04/15/2020 CLINICAL DATA:  Fall. Lucency of the right hemidiaphragm on prior abdomen x-ray. EXAM: ABDOMEN - 1 VIEW DECUBITUS COMPARISON:  04/15/2020. FINDINGS: Decubitus view reveals no free air. Lucency noted over the right hemidiaphragm on previous study represents overlying lung. NG tube again noted with tip over stomach. IMPRESSION: No free air. Lucency noted over the right hemidiaphragm previous study represents overlying lung. Electronically Signed   By: Maisie Fus  Register   On: 04/15/2020 15:55   DG Abd Portable 1V  Addendum Date: 04/15/2020   ADDENDUM REPORT: 04/15/2020 14:36 ADDENDUM: These results were called by telephone at the time of interpretation on 04/15/2020 at 2:36 pm to provider [facility] , who verbally acknowledged these results. Electronically Signed   By: Donzetta Kohut M.D.   On: 04/15/2020 14:36   Result Date: 04/15/2020 CLINICAL DATA:  OG tube placement and intubation. EXAM: PORTABLE ABDOMEN - 1 VIEW COMPARISON:  Previous chest x-rays dated April 14, 2020 FINDINGS: Imaging of the lower chest and upper abdomen shows a gastric tube in place, tip in the mid stomach, side port below the EG junction. Visualized lungs are clear.  EKG leads project over the chest. There is some lucency in the RIGHT upper quadrant over the liver. Visualized bowel gas pattern nonspecific with gas-filled colonic loops in the LEFT upper quadrant. Regional skeletal structures without sign of acute process. IMPRESSION: 1. The  gastric tube is in place with tip in the mid stomach, side port below the EG junction. 2. Lucency in the RIGHT upper quadrant over the liver. This could be related to lung hyperinflation projecting posterior to the RIGHT hemidiaphragm. Would however suggest correlation with decubitus radiograph to exclude the possibility of pneumoperitoneum. 3. A call is out to the referring provider to further discuss findings  in the above case. Electronically Signed: By: Donzetta Kohut M.D. On: 04/15/2020 13:45   [place] EKG SITE RITE  Result Date: 04/15/2020 If Site Rite image not attached, placement could not be confirmed due to current cardiac rhythm.    Medications:   . sodium chloride    . dexmedetomidine (PRECEDEX) IV infusion Stopped (04/16/20 0814)  . dextrose 10 % 1,000 mL with sodium chloride 154 mEq infusion Stopped (04/16/20 0513)  . HYDROmorphone Stopped (04/16/20 2683)  . magnesium sulfate bolus IVPB    . norepinephrine (LEVOPHED) Adult infusion Stopped (04/16/20 0106)  . sodium chloride (hypertonic)    . thiamine injection 500 mg (04/15/20 2137)   . Chlorhexidine Gluconate Cloth  6 each Topical Q0600  . docusate  100 mg Per Tube BID  . enoxaparin (LOVENOX) injection  40 mg Subcutaneous Q24H  . folic acid  1 mg Per Tube Daily  . influenza vaccine adjuvanted  0.5 mL Intramuscular Tomorrow-1000  . methylPREDNISolone (SOLU-MEDROL) injection  40 mg Intravenous Q8H  . midazolam      . multivitamin with minerals  1 tablet Per Tube Daily  . mupirocin ointment  1 application Nasal BID  . polyethylene glycol  17 g Per Tube Daily  . sodium chloride flush  10-40 mL Intracatheter Q12H   acetaminophen, docusate sodium, HYDROmorphone (DILAUDID) injection, midazolam, midazolam, ondansetron (ZOFRAN) IV, oxyCODONE-acetaminophen, polyethylene glycol, sodium chloride flush  Assessment/ Plan:  75 y.o. male with a PMHx of prior history of CVA, alcohol abuse, tobacco abuse, who was admitted to [facility] on  05-10-2020 for evaluation of multiple family history and alterations in sensorium.   1.  Hyponatremia.  Suspect due to volume depletion from alcohol abuse.  Initial sodium was 110. -Serum sodium up to 123.  Resume 3% saline later today at a rate of 20 cc/h.  Discussed with pharmacy.  2.  Hyperkalemia.  Serum potassium improved down to 4.1.  Continue to monitor.  3.  Hypotension.  Now off of norepinephrine.  Continue to monitor blood pressure trend.  4.  Alcohol abuse.  Patient currently on sedation.  Hopefully prevent delirium tremens.   LOS: 2 Munsoor Lateef 3/25/20229:38 AM

## 2020-04-16 NOTE — TOC Initial Note (Signed)
Transition of Care [facility]) - Initial/Assessment Note    Patient Details  Name: Adam Arnold MRN: [id number] Date of Birth: Nov 12, 1945  [facility]) CM/SW Contact:    Marina Goodell Phone Number: [phone] 04/16/2020, 12:59 PM  Clinical Narrative:                  Patient came to ED IVC, due to police department concerns the patient was unable to take care of himself.  Patient did not express SI/HU or A/VH. Patient has been living at a local motel and the Armed forces technical officer found the patient on the floor of his motel room with beer bottles around him.  The patient is currently intubated and does not have contacts for collateral information.  TOC will continue to follow.  Expected Discharge Plan: Long Term Nursing Home Barriers to Discharge: Financial Resources,Transportation,Inadequate or no insurance,Unsafe home situation,Continued Medical Work up,Homeless with medical needs   Patient Goals and CMS Choice        Expected Discharge Plan and Services Expected Discharge Plan: Long Term Nursing Home In-house Referral: Clinical Social Work     Living arrangements for the past 2 months: Homeless                                      Prior Living Arrangements/Services Living arrangements for the past 2 months: Homeless Lives with:: Self          Need for Family Participation in Patient Care: Yes (Comment) Care giver support system in place?: No (comment)   Criminal Activity/Legal Involvement Pertinent to Current Situation/Hospitalization: No - Comment as needed  Activities of Daily Living Home Assistive Devices/Equipment: None ADL Screening (condition at time of admission) Patient's cognitive ability adequate to safely complete daily activities?: Yes Is the patient deaf or have difficulty hearing?: No Does the patient have difficulty seeing, even when wearing glasses/contacts?: No Does the patient have difficulty concentrating, remembering, or making  decisions?: No Patient able to express need for assistance with ADLs?: Yes Does the patient have difficulty dressing or bathing?: Yes Independently performs ADLs?: Yes (appropriate for developmental age) Does the patient have difficulty walking or climbing stairs?: Yes Weakness of Legs: Right Weakness of Arms/Hands: Right  Permission Sought/Granted                  Emotional Assessment Appearance:: Appears older than stated age Attitude/Demeanor/Rapport: Unable to Assess   Orientation: : Fluctuating Orientation (Suspected and/or reported Sundowners) Alcohol / Substance Use: Alcohol Use Psych Involvement: Yes (comment)  Admission diagnosis:  Hyponatremia [E87.1] Multiple falls [R29.6] Patient Active Problem List   Diagnosis Date Noted  . Alcohol abuse 04/15/2020  . Acute delirium 04/15/2020  . Hyponatremia 23-Apr-2020   PCP:  Patient, No Pcp Per Pharmacy:  No Pharmacies Listed    Social Determinants of Health (SDOH) Interventions    Readmission Risk Interventions No flowsheet data found.

## 2020-04-16 NOTE — Progress Notes (Signed)
Levo stopped, 3% saline stopped, D10 stopped. Near 0100, Pt RAAS -5. Dex titrated back up to 1.2, Dilaudid gtt started. Dex titrated down to 0.6.   Foley in place, pt put out 700 ml urine.

## 2020-04-16 NOTE — H&P (Signed)
NAMEWaylen Arnold, MRN:  [id number], DOB:  14-Oct-1945, LOS: 2 ADMISSION DATE:  03/27/2020, CONSULTATION DATE: 04/20/2020 REFERRING MD: Dr. Larinda Buttery, CHIEF COMPLAINT: Falls  History of Present Illness:  This is a 75 yo male with a hx of current ETOH Abuse drinks unknown amount of alcohol daily.  He arrived to [facility] ER on 03/23 via EMS from a local motel where he has been residing over the past 2 weeks following multiple falls, and was  subsequently found crawling on the ground by the Armed forces technical officer.  Upon EMS arrival pt noted to have multiple empty beer bottles around his room, although he reported he did not hav e a drink today.  Pt initially refused transport to the ER for treatment, therefore IVC paperwork completed due to concern of pts inability to care for himself although pt not suicidal or homicidal.    ED course Upon arrival to the ER pt reported he had a stroke 3 yrs ago resulting in frequent falls along with "some intermittent trouble" with his right side.  Lab results revealed Na+ 110, chloride 72, CO2 20, glucose 102, BUN 28, creatinine 1.65, anion gap 18, troponin 24, wbc 11.2, and alcohol level <10.  CXR negative for active cardiopulmonary disease, but concerning for artifact vs. nondisplaced fracture of the anterior left fifth rib.  Due to severe hyponatremia ER physician consulted on call Nephrologist Dr. Cherylann Ratel who recommended placing pt on hypertonic 3% saline infusion.  PCCM contacted for ICU admission.    Pertinent  Medical History  Stroke Current ETOH Abuse  Tobacco Abuse   Significant Hospital Events: Including procedures, antibiotic start and stop dates in addition to other pertinent events   03/23: Pt admitted to ICU with severe hyponatremia requiring 3% hypertonic saline infusion   Interim History / Subjective:  Pt denies suicidal or homicidal ideation  Objective   Blood pressure (!) 136/112, pulse 61, temperature 98 F (36.7 C), temperature source Oral, resp. rate (!)  21, height 5\' 11"  (1.803 m), weight 57.3 kg, SpO2 95 %.    Vent Mode: PRVC FiO2 (%):  [70 %] 70 % Set Rate:  [18 bmp] 18 bmp Vt Set:  [500 mL] 500 mL PEEP:  [5 cmH20] 5 cmH20 Plateau Pressure:  [15 cmH20] 15 cmH20   Intake/Output Summary (Last 24 hours) at 04/16/2020 1655 Last data filed at 04/16/2020 0515 Gross per 24 hour  Intake 341.43 ml  Output 1050 ml  Net -708.57 ml   Filed Weights   04/17/2020 2038 04/15/20 0000 04/15/20 0426  Weight: 59.9 kg 57.3 kg 57.3 kg    Examination: General: acutely ill disheveled frail male, resting in bed NAD HENT: supple, no JVD  Lungs: clear throughout, even, non labored  Cardiovascular: nsr, rrr, no R/G, 2+ radial/2+ distal pulses  Abdomen: +BS x4, soft, non tender, non distended  Extremities: normal bulk and tone, moves all extremities  Skin: forehead abrasion, posterior scalp abrasion  Neuro: alert and oriented, follows commands, PERRLA GU: voiding via urinal   Labs/imaging that I havepersonally reviewed   03/23: Na+ 110, Chloride 72, CO2 20, glucose 102, BUN 28, creatinine 1.65, anion gap 18, troponin 24, wbc 11.2, alcohol level <10, and EKG normal sinus rhythm no signs of ischemia  03/23: CXR negative for active cardiopulmonary disease, but concerning for artifact vs. nondisplaced fracture of the anterior left fifth rib 03/23: CT Head Cervical Spine revealed no acute cervical spine fracture. Multilevel spondylosis and facet hypertrophy greatest from C4-5 through C6-7.  [facility]  Problem list   N/A  Assessment & Plan:   Acute Hypoxic Respiratory Failure Patient required intubation for altered mental status leading to poor pulmonary toilet Intubation required post induction pressor support ETT/MV per protocol  Was allowed to SAT this morning, but oxygen requirement precluded SBT CXR in the morning  Severe hyponatremia likely secondary to beer potomania  Continuous telemetry monitoring  Nephrology consulted appreciate  input-continue 3% hypertonic saline per recommendations  Continue serial sodiums  Random urine sodium, UA, serum osmolality, and urine osmolality results pending  Mildly elevated troponin likely secondary to demand ischemia in the setting of severe hyponatremia  Trend troponin's Dopamine for bradycardia associated with precedex WBC trend is down Check Lactic  Acute renal failure with anion gap metabolic acidosis secondary to ATN Hypochloridemia  Trend BMP  Replace electrolytes as indicated  Monitor UOP  Avoid nephrotoxic medications   Mild leukocytosis with no s/sx of infection  Trend WBC and monitor fever curve  No indication for abx at this time  Protein malnutrition Will consult dietitian appreciate input   Acute metabolic encephalopathy-CT Head negative for acute intracranial process   Frequent falls Hx: Current everyday ETOH abuse and tobacco abuse  CIWA suspended Will continue high dose thiamine  Urine drug screen pending ETOH abuse and smoking cessation counseling provided Maintain fall precautions  Will consult transition care team; pt also homeless  Best practice (evaluated daily)  Diet:  Oral Pain/Anxiety/Delirium protocol (if indicated): No VAP protocol (if indicated): Not indicated DVT prophylaxis: LMWH GI prophylaxis: N/A Glucose control:  SSI No Central venous access:  N/A Arterial line:  N/A Foley:  N/A Mobility:  bed rest  PT consulted: N/A Last date of multidisciplinary goals of care discussion [N/A] Code Status:  full code Disposition: ICU  Labs   CBC: Recent Labs  Lab 05/14/20 2039 May 14, 2020 2222 04/15/20 0406 04/16/20 0420  WBC 11.2* 10.0 11.0* 6.9  NEUTROABS 9.4*  --   --   --   HGB 13.5 13.6 13.5 10.1*  HCT RESULTS UNAVAILABLE DUE TO INTERFERING SUBSTANCE RESULTS UNAVAILABLE DUE TO INTERFERING SUBSTANCE RESULTS UNAVAILABLE DUE TO INTERFERING SUBSTANCE 27.7*  MCV RESULTS UNAVAILABLE DUE TO INTERFERING SUBSTANCE RESULTS UNAVAILABLE DUE  TO INTERFERING SUBSTANCE RESULTS UNAVAILABLE DUE TO INTERFERING SUBSTANCE 90.5  PLT 230 220 218 152    Basic Metabolic Panel: Recent Labs  Lab 05/14/2020 2039 May 14, 2020 2222 May 14, 2020 2326 04/15/20 0406 04/15/20 0951 04/15/20 1430 04/15/20 1835 04/15/20 2345 04/16/20 0420 04/16/20 0958 04/16/20 1615  NA 110* 111*   < > 115*  --    < > 120* 119* 123*  123* 123* 125*  K 4.6  --   --  5.6* 3.5  --   --   --  4.1  --   --   CL 72*  --   --  77*  --   --   --   --  93*  --   --   CO2 20*  --   --  24  --   --   --   --  23  --   --   GLUCOSE 102*  --   --  80  --   --   --   --  208*  --   --   BUN 28*  --   --  32*  --   --   --   --  25*  --   --   CREATININE 1.65* 1.50*  --  1.75*  --   --   --   --  1.09  --   --   CALCIUM 8.9  --   --  9.1  --   --   --   --  8.0*  --   --   MG  --  2.1  --  2.1  --   --   --   --  1.8  --   --   PHOS  --  3.9  --  4.6  --   --   --   --  3.5  --   --    < > = values in this interval not displayed.   GFR: Estimated Creatinine Clearance: 48.9 mL/min (by C-G formula based on SCr of 1.09 mg/dL). Recent Labs  Lab 04/20/20 2039 04/20/20 2222 04/15/20 0406 04/15/20 0622 04/15/20 0951 04/16/20 0420  WBC 11.2* 10.0 11.0*  --   --  6.9  LATICACIDVEN  --   --   --  1.8 0.9  --     Liver Function Tests: Recent Labs  Lab 04/20/2020 2039 04/15/20 0406 04/16/20 0420  AST 75* 84*  --   ALT 30 29  --   ALKPHOS 99 100  --   BILITOT 1.3* 1.2  --   PROT 7.0 6.8  --   ALBUMIN 3.9 3.9 3.5   No results for input(s): LIPASE, AMYLASE in the last 168 hours. No results for input(s): AMMONIA in the last 168 hours.  ABG    Component Value Date/Time   PHART 7.28 (L) 04/15/2020 1354   PCO2ART 49 (H) 04/15/2020 1354   PO2ART 250 (H) 04/15/2020 1354   HCO3 23.0 04/15/2020 1354   ACIDBASEDEF 4.0 (H) 04/15/2020 1354   O2SAT 99.8 04/15/2020 1354     Coagulation Profile: No results for input(s): INR, PROTIME in the last 168 hours.  Cardiac  Enzymes: No results for input(s): CKTOTAL, CKMB, CKMBINDEX, TROPONINI in the last 168 hours.  HbA1C: No results found for: HGBA1C  CBG: Recent Labs  Lab 04/16/20 0410 04/16/20 0625 04/16/20 0723 04/16/20 1109 04/16/20 1613  GLUCAP 224* 200* 179* 136* 127*    Review of Systems: Positives in BOLD   Gen: falls, fever, chills, weight change, fatigue, night sweats HEENT: Denies blurred vision, double vision, hearing loss, tinnitus, sinus congestion, rhinorrhea, sore throat, neck stiffness, dysphagia PULM: Denies shortness of breath, cough, sputum production, hemoptysis, wheezing CV: Denies chest pain, edema, orthopnea, paroxysmal nocturnal dyspnea, palpitations GI: Denies abdominal pain, nausea, vomiting, diarrhea, hematochezia, melena, constipation, change in bowel habits GU: Denies dysuria, hematuria, polyuria, oliguria, urethral discharge Endocrine: Denies hot or cold intolerance, polyuria, polyphagia or appetite change Derm: Denies rash, dry skin, scaling or peeling skin change Heme: Denies easy bruising, bleeding, bleeding gums Neuro: Denies headache, numbness, weakness, slurred speech, loss of memory or consciousness  Past Medical History:  He,  has a past medical history of Stroke (HCC).   Surgical History:   Past Surgical History:  Procedure Laterality Date  . HERNIA REPAIR       Social History:   reports that he has been smoking cigarettes. He has been smoking about 1.00 pack per day. He has never used smokeless tobacco. He reports current alcohol use. He reports previous drug use.   Family History:  His family history is not on file.   Allergies No Known Allergies   Home Medications  Prior to Admission medications   Not on File     Critical care time: 35 minutes

## 2020-04-16 NOTE — Progress Notes (Addendum)
PHARMACY CONSULT NOTE - FOLLOW UP  Pharmacy Consult for Electrolyte Monitoring and Replacement   Recent Labs: Potassium (mmol/L)  Date Value  04/15/2020 3.5   Magnesium (mg/dL)  Date Value  83/41/9622 2.1   Calcium (mg/dL)  Date Value  29/79/8921 9.1   Albumin (g/dL)  Date Value  19/41/7408 3.9   Phosphorus (mg/dL)  Date Value  14/48/1856 4.6   Sodium (mmol/L)  Date Value  04/15/2020 119 (LL)   Assessment: Pt is a 75 y/o M who presented with falls. Pt presented with weakness and fell multiple times and had been more dizzy than usual with difficulty walking. Currently abuses EtOH and unknown amount of alcohol daily. Patient has acute renal failure secondary to ATN and severe hyponatremia likely due to beer potomania. Pharmacy has been consulted to assist with electrolyte management and replacement as indicated.   Creatinine: Scr 1.65>1.5>1.75  Fluids: - Hypertonic saline 3% CI @ 57mL/hr  - D10+NS @ 50 mL/hr  Date    Time    Sodium 0323    2222       Na 111 0323    2326       Na 112 0324    0406       Na 115 0324    1430       Na 117 0324 1835    Na 120 0324 2353    Na 119 0325 0420    Na 123   Goal of Therapy:  Electrolytes within normal limits  Plan:  PICC line placed, Nephrology following. - Na & Cl: 3% saline stopped 3/24 @ 2306 and D10+NS @ 50 mL/hr - continue to monitor closely as full effect of titration likely not seen until next lab. - Monitor electrolytes with AM labs   Otelia Sergeant, PharmD, [facility] 04/16/2020 2:41 AM

## 2020-04-17 ENCOUNTER — Inpatient Hospital Stay: Payer: No Typology Code available for payment source

## 2020-04-17 ENCOUNTER — Encounter: Payer: Self-pay | Admitting: Pulmonary Disease

## 2020-04-17 LAB — BLOOD GAS, ARTERIAL
Acid-Base Excess: 1.1 mmol/L (ref 0.0–2.0)
Bicarbonate: 29.4 mmol/L — ABNORMAL HIGH (ref 20.0–28.0)
FIO2: 100
Mechanical Rate: 16
O2 Saturation: 88.7 %
PEEP: 10 cmH2O
Patient temperature: 37
Pressure control: 13 cmH2O
RATE: 16 resp/min
pCO2 arterial: 64 mmHg — ABNORMAL HIGH (ref 32.0–48.0)
pH, Arterial: 7.27 — ABNORMAL LOW (ref 7.350–7.450)
pO2, Arterial: 64 mmHg — ABNORMAL LOW (ref 83.0–108.0)

## 2020-04-17 LAB — SODIUM
Sodium: 130 mmol/L — ABNORMAL LOW (ref 135–145)
Sodium: 129 mmol/L — ABNORMAL LOW (ref 135–145)
Sodium: 134 mmol/L — ABNORMAL LOW (ref 135–145)
Sodium: 132 mmol/L — ABNORMAL LOW (ref 135–145)

## 2020-04-17 LAB — RENAL FUNCTION PANEL
Albumin: 3.3 g/dL — ABNORMAL LOW (ref 3.5–5.0)
Anion gap: 6 (ref 5–15)
BUN: 26 mg/dL — ABNORMAL HIGH (ref 8–23)
CO2: 27 mmol/L (ref 22–32)
Calcium: 8.4 mg/dL — ABNORMAL LOW (ref 8.9–10.3)
Chloride: 97 mmol/L — ABNORMAL LOW (ref 98–111)
Creatinine, Ser: 0.95 mg/dL (ref 0.61–1.24)
GFR, Estimated: >60 mL/min (ref >60)
Glucose, Bld: 151 mg/dL — ABNORMAL HIGH (ref 70–99)
Phosphorus: 3.4 mg/dL (ref 2.5–4.6)
Potassium: 4.4 mmol/L (ref 3.5–5.1)
Sodium: 130 mmol/L — ABNORMAL LOW (ref 135–145)

## 2020-04-17 LAB — GLUCOSE, CAPILLARY
Glucose-Capillary: 102 mg/dL — ABNORMAL HIGH (ref 70–99)
Glucose-Capillary: 120 mg/dL — ABNORMAL HIGH (ref 70–99)
Glucose-Capillary: 139 mg/dL — ABNORMAL HIGH (ref 70–99)
Glucose-Capillary: 142 mg/dL — ABNORMAL HIGH (ref 70–99)
Glucose-Capillary: 157 mg/dL — ABNORMAL HIGH (ref 70–99)
Glucose-Capillary: 158 mg/dL — ABNORMAL HIGH (ref 70–99)

## 2020-04-17 LAB — CBC
HCT: 30 % — ABNORMAL LOW (ref 39.0–52.0)
Hemoglobin: 10.4 g/dL — ABNORMAL LOW (ref 13.0–17.0)
MCH: 32.8 pg (ref 26.0–34.0)
MCHC: 34.7 g/dL (ref 30.0–36.0)
MCV: 94.6 fL (ref 80.0–100.0)
Platelets: 187 10*3/uL (ref 150–400)
RBC: 3.17 MIL/uL — ABNORMAL LOW (ref 4.22–5.81)
RDW: 12.7 % (ref 11.5–15.5)
WBC: 12.3 10*3/uL — ABNORMAL HIGH (ref 4.0–10.5)
nRBC: 0 % (ref 0.0–0.2)

## 2020-04-17 LAB — PROCALCITONIN: Procalcitonin: 0.33 ng/mL

## 2020-04-17 LAB — MAGNESIUM: Magnesium: 1.8 mg/dL (ref 1.7–2.4)

## 2020-04-17 MED ORDER — ALTEPLASE 2 MG IJ SOLR
2.0000 mg | Freq: Once | INTRAMUSCULAR | Status: AC
  Administered 2020-04-17: 2 mg
  Filled 2020-04-17: qty 2

## 2020-04-17 MED ORDER — SODIUM CHLORIDE 0.9 % IV BOLUS
250.0000 mL | Freq: Once | INTRAVENOUS | Status: AC
  Administered 2020-04-17: 250 mL via INTRAVENOUS

## 2020-04-17 MED ORDER — CHLORHEXIDINE GLUCONATE 0.12% ORAL RINSE (MEDLINE KIT)
15.0000 mL | Freq: Two times a day (BID) | OROMUCOSAL | Status: DC
  Administered 2020-04-17 – 2020-04-23 (×12): 15 mL via OROMUCOSAL

## 2020-04-17 MED ORDER — STERILE WATER FOR INJECTION IJ SOLN
INTRAMUSCULAR | Status: AC
  Filled 2020-04-17: qty 10

## 2020-04-17 MED ORDER — SODIUM CHLORIDE 0.9 % IV SOLN
2.0000 g | Freq: Two times a day (BID) | INTRAVENOUS | Status: DC
  Administered 2020-04-17: 2 g via INTRAVENOUS
  Filled 2020-04-17 (×3): qty 2

## 2020-04-17 MED ORDER — IOHEXOL 350 MG/ML SOLN
75.0000 mL | Freq: Once | INTRAVENOUS | Status: AC | PRN
  Administered 2020-04-17: 75 mL via INTRAVENOUS

## 2020-04-17 MED ORDER — MAGNESIUM SULFATE 2 GM/50ML IV SOLN
2.0000 g | Freq: Once | INTRAVENOUS | Status: AC
  Administered 2020-04-17: 2 g via INTRAVENOUS
  Filled 2020-04-17: qty 50

## 2020-04-17 MED ORDER — ORAL CARE MOUTH RINSE
15.0000 mL | OROMUCOSAL | Status: DC
  Administered 2020-04-17 – 2020-04-23 (×52): 15 mL via OROMUCOSAL

## 2020-04-17 MED ORDER — SODIUM CHLORIDE 0.9 % IV SOLN: INTRAVENOUS | Status: DC

## 2020-04-17 MED ORDER — VANCOMYCIN HCL 1250 MG/250ML IV SOLN
1250.0000 mg | Freq: Once | INTRAVENOUS | Status: AC
  Administered 2020-04-18: 1250 mg via INTRAVENOUS
  Filled 2020-04-17: qty 250

## 2020-04-17 NOTE — Progress Notes (Signed)
Pharmacy Antibiotic Note  Adam Arnold is a 75 y.o. male admitted on 04/21/2020 with HCAP.  Pharmacy has been consulted for Cefepime and Vancomycin dosing.  Plan: Ordered Cefepime 2 gm q12h per indication and renal fxn.  Vancomycin Ordered LD of Vanc 1250 mg x 1 per pt wt: 57.3 kg. Vancomycin 1000 mg IV Q 24 hrs.  Goal AUC 400-550. Expected AUC: 475.4 SCr used: 0.95 TBW 57.3 < IBW 75.3  Pharmacy will continue to monitor Vanc levels (if needed) and SCr and will adjust abx doses if warranted.   Height: 5\' 11"  (180.3 cm) Weight: 57.3 kg (126 lb 5.2 oz) IBW/kg (Calculated) : 75.3  Temp (24hrs), Avg:98.2 F (36.8 C), Min:97.5 F (36.4 C), Max:99 F (37.2 C)  Recent Labs  Lab 04/16/2020 2039 04/11/2020 2222 04/15/20 0406 04/15/20 0622 04/15/20 0951 04/16/20 0420 04/17/20 0440  WBC 11.2* 10.0 11.0*  --   --  6.9 12.3*  CREATININE 1.65* 1.50* 1.75*  --   --  1.09 0.95  LATICACIDVEN  --   --   --  1.8 0.9  --   --     Estimated Creatinine Clearance: 56.1 mL/min (by C-G formula based on SCr of 0.95 mg/dL).    No Known Allergies  Antimicrobials this admission: 03/26 Cefepime >>  03/26 Vancomycin >>    Microbiology results: 3/27 BCx: Pending 3/23 MRSA PCR: Positive  Thank you for allowing pharmacy to be a part of this patient's care.  4/23, PharmD, MBA 04/18/2020 1:17 AM

## 2020-04-17 NOTE — Progress Notes (Signed)
PHARMACY CONSULT NOTE - FOLLOW UP  Pharmacy Consult for Electrolyte Monitoring and Replacement   Recent Labs: Potassium (mmol/L)  Date Value  04/16/2020 4.1   Magnesium (mg/dL)  Date Value  50/93/2671 1.8   Calcium (mg/dL)  Date Value  24/58/0998 8.0 (L)   Albumin (g/dL)  Date Value  33/82/5053 3.5   Phosphorus (mg/dL)  Date Value  97/67/3419 3.5   Sodium (mmol/L)  Date Value  04/17/2020 130 (L)   Assessment: Pt is a 75 y/o M who presented with falls. Pt presented with weakness and dizziness. Pt had fallen multiple times. PMH includes CVA and alcohol abuse. Patient has acute renal failure secondary to ATN and severe hyponatremia likely due to beer potomania. Pharmacy has been consulted to assist with electrolyte management and replacement as indicated.   Creatinine: Scr 1.65>1.5>1.75>1.09  Fluids: - Hypertonic saline 3% CI @ 63mL/hr  -- Stopped 3/24 @ 2306 -- Restarted 3/25 @ 1200 - 3/24 D10+NS @ 50 mL/hr >> 3/25  Date    Time    Sodium 0323    2222       Na 111 0323    2326       Na 112 0324    0406       Na 115 0324    1430       Na 117 0324 1835    Na 120 0324 2353    Na 119 0325 0420    Na 123 0325 0958    Na 123 0325 1615    Na 125 0325 2030    Na 128 0326 0030    Na 130   Nutrition: Tube feeds @ 50 mL/hr restarted 3/25. Prosource feeds daily restarting 3/26   Goal of Therapy:  Electrolytes within normal limits  Plan:  - Na 130: --- 3% saline running at 15 ml/hr - Continue to monitor Na levels q4h  - Monitor electrolytes with AM labs   Otelia Sergeant, PharmD, [facility] 04/17/2020 1:31 AM

## 2020-04-17 NOTE — Progress Notes (Signed)
PHARMACY CONSULT NOTE - FOLLOW UP  Pharmacy Consult for Electrolyte Monitoring and Replacement   Recent Labs: Potassium (mmol/L)  Date Value  04/17/2020 4.4   Magnesium (mg/dL)  Date Value  27/78/2423 1.8   Calcium (mg/dL)  Date Value  53/61/4431 8.4 (L)   Albumin (g/dL)  Date Value  54/00/8676 3.3 (L)   Phosphorus (mg/dL)  Date Value  19/50/9326 3.4   Sodium (mmol/L)  Date Value  04/17/2020 134 (L)   Assessment: Pt is a 75 y/o M who presented with falls. Pt presented with weakness and dizziness. Pt had fallen multiple times. PMH includes CVA and alcohol abuse. Patient has acute renal failure secondary to ATN and severe hyponatremia likely due to beer potomania. Pharmacy has been consulted to assist with electrolyte management and replacement as indicated.   if sodium rises > 4 mEq/L over 2 hours or > 6 mEq/L over 4 hours - Pharmacy will evaluate and contact MD per consult.   Creatinine: Scr 1.65>1.5>1.75>1.09>0.95  Fluids: - Hypertonic saline 3% CI @ 67mL/hr    Date    Time    Sodium 0323    2222       Na 111 0323    2326       Na 112 0324    0406       Na 115 0324    1430       Na 117 0324 1835    Na 120 0324 2353    Na 119 0325 0420    Na 123 0325 0958    Na 123 0325 1615    Na 125 0325 2030    Na 128 0326 0030    Na 130 0326 0440    Na 130 0326 0739       129  0326 1146    134    Nutrition: Tube feeds @ 50 mL/hr restarted 3/25. Prosource feeds daily restarting 3/26   Goal of Therapy:  Electrolytes within normal limits  Plan:  - Na 134:   --- 3% saline currently at 20 ml/hr - Continue to monitor Na levels q4h  - Monitor electrolytes with AM labs  -nephrology following    Bari Mantis PharmD Clinical Pharmacist 04/17/2020

## 2020-04-17 NOTE — Progress Notes (Signed)
[facility]  ROUNDING NOTE   Subjective:   Patient seen and evaluated at bedside. Patient remains intubated. Patient is unable to offer any complaints  Objective:  Vital signs in last 24 hours:  Temp:  [97.3 F (36.3 C)-98.4 F (36.9 C)] 97.5 F (36.4 C) (03/26 0400) Pulse Rate:  [33-122] 51 (03/26 0700) Resp:  [0-25] 0 (03/26 0700) BP: (82-176)/(33-112) 101/45 (03/26 0700) SpO2:  [81 %-100 %] 87 % (03/26 0700) FiO2 (%):  [70 %-100 %] 100 % (03/26 0741)  Weight change:  Filed Weights   04/20/2020 2038 04/15/20 0000 04/15/20 0426  Weight: 59.9 kg 57.3 kg 57.3 kg    Intake/Output: I/O last 3 completed shifts: In: 548.2 [I.V.:349.9; NG/GT:98.3; IV Piggyback:100] Out: 1730 [Urine:1730]   Intake/Output this shift:  No intake/output data recorded.  Physical Exam: General:  Critically ill-appearing  Head:  Endotracheal tube in place  Eyes:  Anicteric  Neck:  Supple  Lungs:   Scattered rhonchi, vent assisted  Heart:  S1S2 no rubs  Abdomen:   Soft, nontender, bowel sounds present  Extremities:  No peripheral edema.  Neurologic:  Intubated, not following commands  Skin:  No acute rash.  Access:  No dialysis access    Basic Metabolic Panel: Recent Labs  Lab 04/17/2020 2039 04/13/2020 2222 04/02/2020 2326 04/15/20 0406 04/15/20 0951 04/15/20 1430 04/16/20 0420 04/16/20 0958 04/16/20 1615 04/16/20 2030 04/17/20 0030 04/17/20 0440 04/17/20 0739  NA 110* 111*   < > 115*  --    < > 123*  123*   < > 125* 128* 130* 130* 129*  K 4.6  --   --  5.6* 3.5  --  4.1  --   --   --   --  4.4  --   CL 72*  --   --  77*  --   --  93*  --   --   --   --  97*  --   CO2 20*  --   --  24  --   --  23  --   --   --   --  27  --   GLUCOSE 102*  --   --  80  --   --  208*  --   --   --   --  151*  --   BUN 28*  --   --  32*  --   --  25*  --   --   --   --  26*  --   CREATININE 1.65* 1.50*  --  1.75*  --   --  1.09  --   --   --   --  0.95  --   CALCIUM 8.9  --   --  9.1   --   --  8.0*  --   --   --   --  8.4*  --   MG  --  2.1  --  2.1  --   --  1.8  --   --   --   --  1.8  --   PHOS  --  3.9  --  4.6  --   --  3.5  --   --   --   --  3.4  --    < > = values in this interval not displayed.    Liver Function Tests: Recent Labs  Lab 04/17/2020 2039 04/15/20 0406 04/16/20 0420 04/17/20 0440  AST 75* 84*  --   --  ALT 30 29  --   --   ALKPHOS 99 100  --   --   BILITOT 1.3* 1.2  --   --   PROT 7.0 6.8  --   --   ALBUMIN 3.9 3.9 3.5 3.3*   No results for input(s): LIPASE, AMYLASE in the last 168 hours. No results for input(s): AMMONIA in the last 168 hours.  CBC: Recent Labs  Lab 05/10/20 2039 2020/05/10 2222 04/15/20 0406 04/16/20 0420 04/17/20 0440  WBC 11.2* 10.0 11.0* 6.9 12.3*  NEUTROABS 9.4*  --   --   --   --   HGB 13.5 13.6 13.5 10.1* 10.4*  HCT RESULTS UNAVAILABLE DUE TO INTERFERING SUBSTANCE RESULTS UNAVAILABLE DUE TO INTERFERING SUBSTANCE RESULTS UNAVAILABLE DUE TO INTERFERING SUBSTANCE 27.7* 30.0*  MCV RESULTS UNAVAILABLE DUE TO INTERFERING SUBSTANCE RESULTS UNAVAILABLE DUE TO INTERFERING SUBSTANCE RESULTS UNAVAILABLE DUE TO INTERFERING SUBSTANCE 90.5 94.6  PLT 230 220 218 152 187    Cardiac Enzymes: No results for input(s): CKTOTAL, CKMB, CKMBINDEX, TROPONINI in the last 168 hours.  BNP: Invalid input(s): POCBNP  CBG: Recent Labs  Lab 04/16/20 1909 04/16/20 2322 04/17/20 0412 04/17/20 0734 04/17/20 1055  GLUCAP 158* 137* 142* 157* 139*    Microbiology: Results for orders placed or performed during the hospital encounter of 05-10-2020  Resp Panel by RT-PCR (Flu A&B, Covid) Nasopharyngeal Swab     Status: None   Collection Time: 05/10/2020 10:22 PM   Specimen: Nasopharyngeal Swab; Nasopharyngeal(NP) swabs in vial transport medium  Result Value Ref Range Status   SARS Coronavirus 2 by RT PCR NEGATIVE NEGATIVE Final    Comment: (NOTE) SARS-CoV-2 target nucleic acids are NOT DETECTED.  The SARS-CoV-2 RNA is generally  detectable in upper respiratory specimens during the acute phase of infection. The lowest concentration of SARS-CoV-2 viral copies this assay can detect is 138 copies/mL. A negative result does not preclude SARS-Cov-2 infection and should not be used as the sole basis for treatment or other patient management decisions. A negative result may occur with  improper specimen collection/handling, submission of specimen other than nasopharyngeal swab, presence of viral mutation(s) within the areas targeted by this assay, and inadequate number of viral copies(<138 copies/mL). A negative result must be combined with clinical observations, patient history, and epidemiological information. The expected result is Negative.  Fact Sheet for Patients:  BloggerCourse.com  Fact Sheet for Healthcare Providers:  SeriousBroker.it  This test is no t yet approved or cleared by the [place] FDA and  has been authorized for detection and/or diagnosis of SARS-CoV-2 by FDA under an Emergency Use Authorization (EUA). This EUA will remain  in effect (meaning this test can be used) for the duration of the COVID-19 declaration under Section 564(b)(1) of the Act, 21 U.S.C.section 360bbb-3(b)(1), unless the authorization is terminated  or revoked sooner.       Influenza A by PCR NEGATIVE NEGATIVE Final   Influenza B by PCR NEGATIVE NEGATIVE Final    Comment: (NOTE) The Xpert Xpress SARS-CoV-2/FLU/RSV plus assay is intended as an aid in the diagnosis of influenza from Nasopharyngeal swab specimens and should not be used as a sole basis for treatment. Nasal washings and aspirates are unacceptable for Xpert Xpress SARS-CoV-2/FLU/RSV testing.  Fact Sheet for Patients: BloggerCourse.com  Fact Sheet for Healthcare Providers: SeriousBroker.it  This test is not yet approved or cleared by the [place] FDA  and has been authorized for detection and/or diagnosis of SARS-CoV-2 by FDA under an Emergency Use Authorization (  EUA). This EUA will remain in effect (meaning this test can be used) for the duration of the COVID-19 declaration under Section 564(b)(1) of the Act, 21 U.S.C. section 360bbb-3(b)(1), unless the authorization is terminated or revoked.  Performed at [facility], [address] [place], [place] [address]   MRSA PCR Screening     Status: Abnormal   Collection Time: Apr 18, 2020 11:35 PM   Specimen: Nasal Mucosa; Nasopharyngeal  Result Value Ref Range Status   MRSA by PCR POSITIVE (A) NEGATIVE Final    Comment:        The GeneXpert MRSA Assay (FDA approved for NASAL specimens only), is one component of a comprehensive MRSA colonization surveillance program. It is not intended to diagnose MRSA infection nor to guide or monitor treatment for MRSA infections. RESULT CALLED TO, READ BACK BY AND VERIFIED WITH: CINTHIA VEZQUEZ @0400  04/15/20 RH Performed at [facility], [address] [place], [place]     Coagulation Studies: No results for input(s): LABPROT, INR in the last 72 hours.  Urinalysis: Recent Labs    2020-04-18 2043  COLORURINE YELLOW*  LABSPEC 1.006  PHURINE 6.0  GLUCOSEU NEGATIVE  HGBUR MODERATE*  BILIRUBINUR NEGATIVE  KETONESUR 5*  PROTEINUR 30*  NITRITE NEGATIVE  LEUKOCYTESUR NEGATIVE      Imaging: DG Chest Left Decubitus  Result Date: 04/15/2020 CLINICAL DATA:  Fall.  Evaluate for right pneumothorax. EXAM: CHEST - LEFT DECUBITUS COMPARISON:  04/15/2020 FINDINGS: Endotracheal tube tip remains at the level of clavicles approximately 10 cm above the carina. Nasogastric tube is seen entering the stomach. No right-sided pneumothorax or hemothorax visualized. Mild streaky opacity in the medial right lower lung shows no significant change. No new or worsening areas of pulmonary opacity are seen. Pulmonary hyperinflation  again seen. Subacute or chronic right lower lateral rib fracture deformities are again seen. Subacute or chronic fracture deformity of the right lateral 5th rib is also seen. IMPRESSION: Several subacute versus chronic right rib fracture deformities. No evidence of pneumothorax or hemothorax. Stable mild infiltrate in medial right lower lung. High endotracheal tube position again seen. Electronically Signed   By: 04/17/2020 M.D.   On: 04/15/2020 16:05   DG Chest Port 1 View  Result Date: 04/17/2020 CLINICAL DATA:  Endotracheal tube EXAM: PORTABLE CHEST 1 VIEW COMPARISON:  04/15/2020 FINDINGS: Endotracheal tube terminates 8.4 cm above the carina. Mild patchy right lower lobe opacity, suspicious for pneumonia, possibly mildly progressive. Mild left basilar opacity, atelectasis versus pneumonia. No pleural effusion or pneumothorax. The heart is normal in size. New right subclavian venous catheter terminates at the cavoatrial junction. Enteric tube courses below the diaphragm. IMPRESSION: Mild patchy right lower lobe opacity, suspicious for pneumonia, mildly progressive. Mild left basilar opacity, atelectasis versus pneumonia. Endotracheal tube terminates 8.4 cm above the carina. New right subclavian venous catheter terminates at the cavoatrial junction. No pneumothorax. Electronically Signed   By: 04/17/2020 M.D.   On: 04/17/2020 05:53   DG Chest Port 1 View  Result Date: 04/15/2020 CLINICAL DATA:  OG tube placement and post intubation. EXAM: PORTABLE CHEST 1 VIEW COMPARISON:  04-18-20 FINDINGS: Interval placement of an endotracheal tube, tip in the proximal trachea approximately 6.5 cm above the carina. Gastric tube courses through the field of view into the upper abdomen. Subtle RIGHT basilar opacity has developed since the previous exam. No lobar consolidation. No sign of pleural effusion on frontal radiograph. Regional skeletal structures show no acute process. EKG leads project over  the  chest. IMPRESSION: 1. Interval placement of an endotracheal tube, tip in the proximal trachea approximately 6.5 cm above the carina. 2. Subtle RIGHT basilar opacity has developed since the previous exam, correlate with any risk for aspiration. 3. Lucency beneath the RIGHT hemidiaphragm not as pronounced as on the previous image. Again this finding may be related to lung projecting behind the diaphragm. See abdominal radiograph the same date for further detail. Electronically Signed   By: Donzetta KohutGeoffrey  Wile M.D.   On: 04/15/2020 13:49   DG Abd Decub  Result Date: 04/15/2020 CLINICAL DATA:  Fall. Lucency of the right hemidiaphragm on prior abdomen x-ray. EXAM: ABDOMEN - 1 VIEW DECUBITUS COMPARISON:  04/15/2020. FINDINGS: Decubitus view reveals no free air. Lucency noted over the right hemidiaphragm on previous study represents overlying lung. NG tube again noted with tip over stomach. IMPRESSION: No free air. Lucency noted over the right hemidiaphragm previous study represents overlying lung. Electronically Signed   By: Maisie Fushomas  Register   On: 04/15/2020 15:55   DG Abd Portable 1V  Addendum Date: 04/15/2020   ADDENDUM REPORT: 04/15/2020 14:36 ADDENDUM: These results were called by telephone at the time of interpretation on 04/15/2020 at 2:36 pm to [facility] , who verbally acknowledged these results. Electronically Signed   By: Donzetta KohutGeoffrey  Wile M.D.   On: 04/15/2020 14:36   Result Date: 04/15/2020 CLINICAL DATA:  OG tube placement and intubation. EXAM: PORTABLE ABDOMEN - 1 VIEW COMPARISON:  Previous chest x-rays dated April 14, 2020 FINDINGS: Imaging of the lower chest and upper abdomen shows a gastric tube in place, tip in the mid stomach, side port below the EG junction. Visualized lungs are clear.  EKG leads project over the chest. There is some lucency in the RIGHT upper quadrant over the liver. Visualized bowel gas pattern nonspecific with gas-filled colonic loops in the LEFT upper quadrant.  Regional skeletal structures without sign of acute process. IMPRESSION: 1. The gastric tube is in place with tip in the mid stomach, side port below the EG junction. 2. Lucency in the RIGHT upper quadrant over the liver. This could be related to lung hyperinflation projecting posterior to the RIGHT hemidiaphragm. Would however suggest correlation with decubitus radiograph to exclude the possibility of pneumoperitoneum. 3. A call is out to the referring provider to further discuss findings in the above case. Electronically Signed: By: Donzetta KohutGeoffrey  Wile M.D. On: 04/15/2020 13:45   US EKG SITE RITE  Result Date: 04/15/2020 If Site Rite image not attached, placement could not be confirmed due to current cardiac rhythm.    Medications:   . sodium chloride    . dexmedetomidine (PRECEDEX) IV infusion 0.4 mcg/kg/hr (04/17/20 0900)  . DOPamine 2.5 mcg/kg/min (04/16/20 1801)  . feeding supplement (VITAL AF 1.2 CAL) 1,000 mL (04/16/20 1714)  . HYDROmorphone 0.5 mg/hr (04/16/20 2237)  . norepinephrine (LEVOPHED) Adult infusion 4 mcg/min (04/17/20 0904)  . sodium chloride (hypertonic) 20 mL/hr at 04/17/20 0553  . thiamine injection 500 mg (04/16/20 2027)   . Chlorhexidine Gluconate Cloth  6 each Topical Q0600  . docusate  100 mg Per Tube BID  . enoxaparin (LOVENOX) injection  40 mg Subcutaneous Q24H  . feeding supplement (PROSource TF)  45 mL Per Tube Daily  . folic acid  1 mg Per Tube Daily  . free water  30 mL Per Tube Q4H  . influenza vaccine adjuvanted  0.5 mL Intramuscular Tomorrow-1000  . insulin aspart  0-9 Units Subcutaneous Q4H  . methylPREDNISolone (SOLU-MEDROL)  injection  40 mg Intravenous Q8H  . multivitamin with minerals  1 tablet Per Tube Daily  . mupirocin ointment  1 application Nasal BID  . polyethylene glycol  17 g Per Tube Daily  . sodium chloride flush  10-40 mL Intracatheter Q12H   acetaminophen, docusate sodium, HYDROmorphone (DILAUDID) injection, midazolam, ondansetron  (ZOFRAN) IV, oxyCODONE-acetaminophen, polyethylene glycol, sodium chloride flush  Assessment/ Plan:  75 y.o. male with a PMHx of prior history of CVA, alcohol abuse, tobacco abuse, who was admitted to [facility] on 03/26/2020 for evaluation of multiple family history and alterations in sensorium.   1.  Renal Patient was admitted with acute kidney injury AKI secondary to prerenal/ATN Patient had a peak creatinine of 1.75 Patient creatinine is now better   2. hyponatremia.   Patient hyponatremia was most likely multifactorial Alcohol abuse/ volume depletion  Patient's initial sodium was 110. Patient was started on 3% saline  Patient sodium is now much better  Patient sodium was 110 on March 23 and is now 130 on March 26 So patient has had 20 mEq change in 3 days The patient has had less than 8 mEq change of sodium in past 3 days    2.  Hyperkalemia.   Patient had hyperkalemia earlier Patient potassium is now better   3.  Hypotension.   Patient required vasopressors Patient blood pressure is now better  4.  Alcohol abuse.  Patient currently on sedation.     5.  Anemia of chronic disease Patient hemoglobin stable at 10.4 Patient  earlier had hemoglobin of 13.5 by the time of presentation this was most likely hemoconcentration  Plan We will discontinue 3% We will follow patient sodium     LOS: 3 Adam Arnold s [facility] 3/26/202211:03 AM

## 2020-04-17 NOTE — H&P (Signed)
NAMEDorance Arnold, MRN:  [id number], DOB:  August 18, 1945, LOS: 3 ADMISSION DATE:  04/12/2020, CONSULTATION DATE: 04/06/2020 REFERRING MD: Dr. Larinda Buttery, CHIEF COMPLAINT: Falls  History of Present Illness:  This is a 75 yo male with a hx of current ETOH Abuse drinks unknown amount of alcohol daily.  He arrived to [facility] ER on 03/23 via EMS from a local motel where he has been residing over the past 2 weeks following multiple falls, and was  subsequently found crawling on the ground by the Armed forces technical officer.  Upon EMS arrival pt noted to have multiple empty beer bottles around his room, although he reported he did not hav e a drink today.  Pt initially refused transport to the ER for treatment, therefore IVC paperwork completed due to concern of pts inability to care for himself although pt not suicidal or homicidal.    ED course Upon arrival to the ER pt reported he had a stroke 3 yrs ago resulting in frequent falls along with "some intermittent trouble" with his right side.  Lab results revealed Na+ 110, chloride 72, CO2 20, glucose 102, BUN 28, creatinine 1.65, anion gap 18, troponin 24, wbc 11.2, and alcohol level <10.  CXR negative for active cardiopulmonary disease, but concerning for artifact vs. nondisplaced fracture of the anterior left fifth rib.  Due to severe hyponatremia ER physician consulted on call Nephrologist Dr. Cherylann Ratel who recommended placing pt on hypertonic 3% saline infusion.  PCCM contacted for ICU admission.    Pertinent  Medical History  Stroke Current ETOH Abuse  Tobacco Abuse   Significant Hospital Events: Including procedures, antibiotic start and stop dates in addition to other pertinent events   03/23: Pt admitted to ICU with severe hyponatremia requiring 3% hypertonic saline infusion   Interim History / Subjective:  Pt denies suicidal or homicidal ideation  Objective   Blood pressure (!) 160/72, pulse (!) 59, temperature 99 F (37.2 C), temperature source Oral, resp. rate  20, height 5\' 11"  (1.803 m), weight 57.3 kg, SpO2 94 %.    Vent Mode: PCV FiO2 (%):  [70 %-100 %] 100 % Set Rate:  [16 bmp-18 bmp] 16 bmp Vt Set:  [500 mL] 500 mL PEEP:  [5 cmH20-10 cmH20] 10 cmH20 Plateau Pressure:  [17 cmH20-23 cmH20] 17 cmH20   Intake/Output Summary (Last 24 hours) at 04/17/2020 1625 Last data filed at 04/17/2020 1600 Gross per 24 hour  Intake 498.2 ml  Output 1630 ml  Net -1131.8 ml   Filed Weights   03/28/2020 2038 04/15/20 0000 04/15/20 0426  Weight: 59.9 kg 57.3 kg 57.3 kg    Examination: General: acutely ill disheveled frail male, resting in bed NAD HENT: supple, no JVD  Lungs: clear throughout, even, non labored  Cardiovascular: nsr, rrr, no R/G, 2+ radial/2+ distal pulses  Abdomen: +BS x4, soft, non tender, non distended  Extremities: normal bulk and tone, moves all extremities  Skin: forehead abrasion, posterior scalp abrasion  Neuro: alert and oriented, follows commands, PERRLA GU: voiding via urinal   Labs/imaging that I havepersonally reviewed   03/23: Na+ 110, Chloride 72, CO2 20, glucose 102, BUN 28, creatinine 1.65, anion gap 18, troponin 24, wbc 11.2, alcohol level <10, and EKG normal sinus rhythm no signs of ischemia  03/23: CXR negative for active cardiopulmonary disease, but concerning for artifact vs. nondisplaced fracture of the anterior left fifth rib 03/23: CT Head Cervical Spine revealed no acute cervical spine fracture. Multilevel spondylosis and facet hypertrophy greatest from C4-5 through  C6-7.  Resolved Hospital Problem list   N/A  Assessment & Plan:   Acute Hypoxic Respiratory Failure Patient required intubation for altered mental status leading to poor pulmonary toilet Intubation required post induction pressor support ETT/MV per protocol  Was allowed to SAT this morning, but oxygen requirement precluded SBT CXR inconclusive of PTX, send for CT and assess for PE given high FiO2  Severe hyponatremia likely secondary to beer  potomania  Continuous telemetry monitoring  Nephrology consulted appreciate input-continue 3% hypertonic saline per recommendations  Hold 3% once sodium in excess of 130 Continue serial sodiums  Random urine sodium, UA, serum osmolality, and urine osmolality results pending  Mildly elevated troponin likely secondary to demand ischemia in the setting of severe hyponatremia  Trend troponin's Dopamine for bradycardia associated with precedex WBC trend is down Check Lactic  Acute renal failure with anion gap metabolic acidosis secondary to ATN Hypochloridemia resolved Trend BMP  Replace electrolytes as indicated  Monitor UOP  Avoid nephrotoxic medications   Mild leukocytosis with no s/sx of infection  Trend WBC and monitor fever curve  No indication for abx at this time  Protein malnutrition Will consult dietitian appreciate input   Acute metabolic encephalopathy-CT Head negative for acute intracranial process   Frequent falls Hx: Current everyday ETOH abuse and tobacco abuse  CIWA suspended Remains on precedex/prn versed Will continue high dose thiamine  Urine drug screen pending ETOH abuse and smoking cessation counseling provided Maintain fall precautions  Will consult transition care team; pt also homeless  Best practice (evaluated daily)  Diet:  Oral Pain/Anxiety/Delirium protocol (if indicated): No VAP protocol (if indicated): Not indicated DVT prophylaxis: LMWH GI prophylaxis: N/A Glucose control:  SSI No Central venous access:  N/A Arterial line:  N/A Foley:  N/A Mobility:  bed rest  PT consulted: N/A Last date of multidisciplinary goals of care discussion [N/A] Code Status:  full code Disposition: ICU  Labs   CBC: Recent Labs  Lab 08-May-2020 2039 May 08, 2020 2222 04/15/20 0406 04/16/20 0420 04/17/20 0440  WBC 11.2* 10.0 11.0* 6.9 12.3*  NEUTROABS 9.4*  --   --   --   --   HGB 13.5 13.6 13.5 10.1* 10.4*  HCT RESULTS UNAVAILABLE DUE TO INTERFERING  SUBSTANCE RESULTS UNAVAILABLE DUE TO INTERFERING SUBSTANCE RESULTS UNAVAILABLE DUE TO INTERFERING SUBSTANCE 27.7* 30.0*  MCV RESULTS UNAVAILABLE DUE TO INTERFERING SUBSTANCE RESULTS UNAVAILABLE DUE TO INTERFERING SUBSTANCE RESULTS UNAVAILABLE DUE TO INTERFERING SUBSTANCE 90.5 94.6  PLT 230 220 218 152 187    Basic Metabolic Panel: Recent Labs  Lab 05-08-2020 2039 05-08-20 2222 2020/05/08 2326 04/15/20 0406 04/15/20 0951 04/15/20 1430 04/16/20 0420 04/16/20 0958 04/16/20 2030 04/17/20 0030 04/17/20 0440 04/17/20 0739 04/17/20 1146  NA 110* 111*   < > 115*  --    < > 123*  123*   < > 128* 130* 130* 129* 134*  K 4.6  --   --  5.6* 3.5  --  4.1  --   --   --  4.4  --   --   CL 72*  --   --  77*  --   --  93*  --   --   --  97*  --   --   CO2 20*  --   --  24  --   --  23  --   --   --  27  --   --   GLUCOSE 102*  --   --  80  --   --  208*  --   --   --  151*  --   --   BUN 28*  --   --  32*  --   --  25*  --   --   --  26*  --   --   CREATININE 1.65* 1.50*  --  1.75*  --   --  1.09  --   --   --  0.95  --   --   CALCIUM 8.9  --   --  9.1  --   --  8.0*  --   --   --  8.4*  --   --   MG  --  2.1  --  2.1  --   --  1.8  --   --   --  1.8  --   --   PHOS  --  3.9  --  4.6  --   --  3.5  --   --   --  3.4  --   --    < > = values in this interval not displayed.   GFR: Estimated Creatinine Clearance: 56.1 mL/min (by C-G formula based on SCr of 0.95 mg/dL). Recent Labs  Lab 04/10/2020 2222 04/15/20 0406 04/15/20 0622 04/15/20 0951 04/16/20 0420 04/17/20 0440  WBC 10.0 11.0*  --   --  6.9 12.3*  LATICACIDVEN  --   --  1.8 0.9  --   --     Liver Function Tests: Recent Labs  Lab 04/10/2020 2039 04/15/20 0406 04/16/20 0420 04/17/20 0440  AST 75* 84*  --   --   ALT 30 29  --   --   ALKPHOS 99 100  --   --   BILITOT 1.3* 1.2  --   --   PROT 7.0 6.8  --   --   ALBUMIN 3.9 3.9 3.5 3.3*   No results for input(s): LIPASE, AMYLASE in the last 168 hours. No results for input(s):  AMMONIA in the last 168 hours.  ABG    Component Value Date/Time   PHART 7.27 (L) 04/17/2020 1200   PCO2ART 64 (H) 04/17/2020 1200   PO2ART 64 (L) 04/17/2020 1200   HCO3 29.4 (H) 04/17/2020 1200   ACIDBASEDEF 4.0 (H) 04/15/2020 1354   O2SAT 88.7 04/17/2020 1200     Coagulation Profile: No results for input(s): INR, PROTIME in the last 168 hours.  Cardiac Enzymes: No results for input(s): CKTOTAL, CKMB, CKMBINDEX, TROPONINI in the last 168 hours.  HbA1C: No results found for: HGBA1C  CBG: Recent Labs  Lab 04/16/20 2322 04/17/20 0412 04/17/20 0734 04/17/20 1055 04/17/20 1548  GLUCAP 137* 142* 157* 139* 102*    Review of Systems: Positives in BOLD   Gen: falls, fever, chills, weight change, fatigue, night sweats HEENT: Denies blurred vision, double vision, hearing loss, tinnitus, sinus congestion, rhinorrhea, sore throat, neck stiffness, dysphagia PULM: Denies shortness of breath, cough, sputum production, hemoptysis, wheezing CV: Denies chest pain, edema, orthopnea, paroxysmal nocturnal dyspnea, palpitations GI: Denies abdominal pain, nausea, vomiting, diarrhea, hematochezia, melena, constipation, change in bowel habits GU: Denies dysuria, hematuria, polyuria, oliguria, urethral discharge Endocrine: Denies hot or cold intolerance, polyuria, polyphagia or appetite change Derm: Denies rash, dry skin, scaling or peeling skin change Heme: Denies easy bruising, bleeding, bleeding gums Neuro: Denies headache, numbness, weakness, slurred speech, loss of memory or consciousness  Past Medical History:  He,  has a past medical history of Stroke (HCC).   Surgical History:  Past Surgical History:  Procedure Laterality Date  . HERNIA REPAIR       Social History:   reports that he has been smoking cigarettes. He has been smoking about 1.00 pack per day. He has never used smokeless tobacco. He reports current alcohol use. He reports previous drug use.   Family History:  His  family history is not on file.   Allergies No Known Allergies   Home Medications  Prior to Admission medications   Not on File     Critical care time: 35 minutes

## 2020-04-17 NOTE — Progress Notes (Signed)
PHARMACY CONSULT NOTE - FOLLOW UP  Pharmacy Consult for Electrolyte Monitoring and Replacement   Recent Labs: Potassium (mmol/L)  Date Value  04/17/2020 4.4   Magnesium (mg/dL)  Date Value  63/14/9702 1.8   Calcium (mg/dL)  Date Value  63/78/5885 8.4 (L)   Albumin (g/dL)  Date Value  02/77/4128 3.3 (L)   Phosphorus (mg/dL)  Date Value  78/67/6720 3.4   Sodium (mmol/L)  Date Value  04/17/2020 130 (L)   Assessment: Pt is a 75 y/o M who presented with falls. Pt presented with weakness and dizziness. Pt had fallen multiple times. PMH includes CVA and alcohol abuse. Patient has acute renal failure secondary to ATN and severe hyponatremia likely due to beer potomania. Pharmacy has been consulted to assist with electrolyte management and replacement as indicated.   Creatinine: Scr 1.65>1.5>1.75>1.09  Fluids: - Hypertonic saline 3% CI @ 79mL/hr  -- Stopped 3/24 @ 2306 -- Restarted 3/25 @ 1200 - 3/24 D10+NS @ 50 mL/hr >> 3/25  Date    Time    Sodium 0323    2222       Na 111 0323    2326       Na 112 0324    0406       Na 115 0324    1430       Na 117 0324 1835    Na 120 0324 2353    Na 119 0325 0420    Na 123 0325 0958    Na 123 0325 1615    Na 125 0325 2030    Na 128 0326 0030    Na 130 0326 0440    Na 130   Nutrition: Tube feeds @ 50 mL/hr restarted 3/25. Prosource feeds daily restarting 3/26   Goal of Therapy:  Electrolytes within normal limits  Plan:  - Na 130: --- 3% saline increased to 20 ml/hr - Continue to monitor Na levels q4h  - Monitor electrolytes with AM labs   Otelia Sergeant, PharmD, [facility] 04/17/2020 5:50 AM

## 2020-04-17 NOTE — Progress Notes (Addendum)
PHARMACY CONSULT NOTE - FOLLOW UP  Pharmacy Consult for Electrolyte Monitoring and Replacement   Recent Labs: Potassium (mmol/L)  Date Value  04/17/2020 4.4   Magnesium (mg/dL)  Date Value  91/47/8295 1.8   Calcium (mg/dL)  Date Value  62/13/0865 8.4 (L)   Albumin (g/dL)  Date Value  78/46/9629 3.3 (L)   Phosphorus (mg/dL)  Date Value  52/84/1324 3.4   Sodium (mmol/L)  Date Value  04/17/2020 129 (L)   Assessment: Pt is a 75 y/o M who presented with falls. Pt presented with weakness and dizziness. Pt had fallen multiple times. PMH includes CVA and alcohol abuse. Patient has acute renal failure secondary to ATN and severe hyponatremia likely due to beer potomania. Pharmacy has been consulted to assist with electrolyte management and replacement as indicated.   if sodium rises > 4 mEq/L over 2 hours or > 6 mEq/L over 4 hours - Pharmacy will evaluate and contact MD per consult.   Creatinine: Scr 1.65>1.5>1.75>1.09  Fluids: - Hypertonic saline 3% CI @ 22mL/hr    Date    Time    Sodium 0323    2222       Na 111 0323    2326       Na 112 0324    0406       Na 115 0324    1430       Na 117 0324 1835    Na 120 0324 2353    Na 119 0325 0420    Na 123 0325 0958    Na 123 0325 1615    Na 125 0325 2030    Na 128 0326 0030    Na 130 0326 0440    Na 130 0326 0739       129     Nutrition: Tube feeds @ 50 mL/hr restarted 3/25. Prosource feeds daily restarting 3/26   Goal of Therapy:  Electrolytes within normal limits  Plan:  - Na 129: --- 3% saline currently at 20 ml/hr - Continue to monitor Na levels q4h  - Monitor electrolytes with AM labs   - Mag 1.8 -NP ordered Magnesium 2 gm IV x1  Bari Mantis PharmD Clinical Pharmacist 04/17/2020

## 2020-04-17 NOTE — Progress Notes (Signed)
@   start of shift, Dex @ 1. Weaned down to 0.6. Multiple pushes of versed given to avoid increasing Dex gtt. When pt is awake, he is increasingly anxious, restless, and reaching for tube/lines. Mitts ON.Tube feed rate increased to 30 ml/hr @ 0000. Increase to 40 ml/hr @ 0800 if pt continues to tolerate. Foley in place, putting out adequate urine. No BM overnight. Turn q2. Pt in no acute distress @ this time. Will continue to monitor.

## 2020-04-18 LAB — SODIUM
Sodium: 136 mmol/L (ref 135–145)
Sodium: 136 mmol/L (ref 135–145)

## 2020-04-18 LAB — GLUCOSE, CAPILLARY
Glucose-Capillary: 122 mg/dL — ABNORMAL HIGH (ref 70–99)
Glucose-Capillary: 136 mg/dL — ABNORMAL HIGH (ref 70–99)
Glucose-Capillary: 107 mg/dL — ABNORMAL HIGH (ref 70–99)
Glucose-Capillary: 170 mg/dL — ABNORMAL HIGH (ref 70–99)
Glucose-Capillary: 183 mg/dL — ABNORMAL HIGH (ref 70–99)
Glucose-Capillary: 194 mg/dL — ABNORMAL HIGH (ref 70–99)

## 2020-04-18 LAB — CBC
HCT: 31.7 % — ABNORMAL LOW (ref 39.0–52.0)
Hemoglobin: 10.5 g/dL — ABNORMAL LOW (ref 13.0–17.0)
MCH: 32.7 pg (ref 26.0–34.0)
MCHC: 33.1 g/dL (ref 30.0–36.0)
MCV: 98.8 fL (ref 80.0–100.0)
Platelets: 196 10*3/uL (ref 150–400)
RBC: 3.21 MIL/uL — ABNORMAL LOW (ref 4.22–5.81)
RDW: 13.4 % (ref 11.5–15.5)
WBC: 9.8 10*3/uL (ref 4.0–10.5)
nRBC: 0 % (ref 0.0–0.2)

## 2020-04-18 LAB — MAGNESIUM: Magnesium: 1.9 mg/dL (ref 1.7–2.4)

## 2020-04-18 LAB — PHOSPHORUS: Phosphorus: 3.6 mg/dL (ref 2.5–4.6)

## 2020-04-18 LAB — BLOOD GAS, ARTERIAL
Acid-base deficit: 0.4 mmol/L (ref 0.0–2.0)
Bicarbonate: 26.9 mmol/L (ref 20.0–28.0)
FIO2: 100
O2 Saturation: 95.1 %
PEEP: 10 cmH2O
Patient temperature: 37
Pressure control: 13 cmH2O
RATE: 16 resp/min
pCO2 arterial: 56 mmHg — ABNORMAL HIGH (ref 32.0–48.0)
pH, Arterial: 7.29 — ABNORMAL LOW (ref 7.350–7.450)
pO2, Arterial: 85 mmHg (ref 83.0–108.0)

## 2020-04-18 LAB — COMPREHENSIVE METABOLIC PANEL
ALT: 16 U/L (ref 0–44)
AST: 24 U/L (ref 15–41)
Albumin: 2.8 g/dL — ABNORMAL LOW (ref 3.5–5.0)
Alkaline Phosphatase: 48 U/L (ref 38–126)
Anion gap: 5 (ref 5–15)
BUN: 32 mg/dL — ABNORMAL HIGH (ref 8–23)
CO2: 27 mmol/L (ref 22–32)
Calcium: 8.6 mg/dL — ABNORMAL LOW (ref 8.9–10.3)
Chloride: 101 mmol/L (ref 98–111)
Creatinine, Ser: 0.79 mg/dL (ref 0.61–1.24)
GFR, Estimated: >60 mL/min (ref >60)
Glucose, Bld: 119 mg/dL — ABNORMAL HIGH (ref 70–99)
Potassium: 4.8 mmol/L (ref 3.5–5.1)
Sodium: 133 mmol/L — ABNORMAL LOW (ref 135–145)
Total Bilirubin: 0.5 mg/dL (ref 0.3–1.2)
Total Protein: 5.6 g/dL — ABNORMAL LOW (ref 6.5–8.1)

## 2020-04-18 LAB — PROCALCITONIN: Procalcitonin: 0.53 ng/mL

## 2020-04-18 MED ORDER — INSULIN ASPART 100 UNIT/ML ~~LOC~~ SOLN
0.0000 [IU] | SUBCUTANEOUS | Status: DC
  Administered 2020-04-19 (×2): 3 [IU] via SUBCUTANEOUS
  Administered 2020-04-19: 5 [IU] via SUBCUTANEOUS
  Administered 2020-04-19 (×3): 3 [IU] via SUBCUTANEOUS
  Administered 2020-04-20: 5 [IU] via SUBCUTANEOUS
  Filled 2020-04-18 (×6): qty 1

## 2020-04-18 MED ORDER — VANCOMYCIN HCL 1000 MG/200ML IV SOLN
1000.0000 mg | INTRAVENOUS | Status: DC
  Administered 2020-04-19: 1000 mg via INTRAVENOUS
  Filled 2020-04-18 (×2): qty 200

## 2020-04-18 MED ORDER — CHLORHEXIDINE GLUCONATE CLOTH 2 % EX PADS
6.0000 | MEDICATED_PAD | Freq: Every day | CUTANEOUS | Status: DC
  Administered 2020-04-18 – 2020-04-22 (×5): 6 via TOPICAL

## 2020-04-18 MED ORDER — PANTOPRAZOLE SODIUM 40 MG PO PACK
40.0000 mg | PACK | Freq: Every day | ORAL | Status: DC
  Administered 2020-04-18 – 2020-04-22 (×5): 40 mg
  Filled 2020-04-18 (×5): qty 20

## 2020-04-18 MED ORDER — SODIUM CHLORIDE 0.9 % IV SOLN
2.0000 g | Freq: Three times a day (TID) | INTRAVENOUS | Status: DC
  Administered 2020-04-18 – 2020-04-20 (×6): 2 g via INTRAVENOUS
  Filled 2020-04-18 (×7): qty 2

## 2020-04-18 MED ORDER — MIDAZOLAM 50MG/50ML (1MG/ML) PREMIX INFUSION
0.5000 mg/h | INTRAVENOUS | Status: DC
  Administered 2020-04-18: 5 mg/h via INTRAVENOUS
  Administered 2020-04-18: 2 mg/h via INTRAVENOUS
  Administered 2020-04-19: 0.5 mg/h via INTRAVENOUS
  Administered 2020-04-19: 7 mg/h via INTRAVENOUS
  Administered 2020-04-21: 1.5 mg/h via INTRAVENOUS
  Administered 2020-04-22: 3 mg/h via INTRAVENOUS
  Filled 2020-04-18 (×6): qty 50

## 2020-04-18 MED ORDER — THIAMINE HCL 100 MG/ML IJ SOLN
100.0000 mg | INTRAMUSCULAR | Status: DC
  Administered 2020-04-18 – 2020-04-23 (×6): 100 mg via INTRAVENOUS
  Filled 2020-04-18 (×6): qty 2

## 2020-04-18 NOTE — Progress Notes (Signed)
PHARMACY CONSULT NOTE - FOLLOW UP  Pharmacy Consult for Electrolyte Monitoring and Replacement   Recent Labs: Potassium (mmol/L)  Date Value  04/18/2020 4.8   Magnesium (mg/dL)  Date Value  09/98/3382 1.9   Calcium (mg/dL)  Date Value  50/53/9767 8.6 (L)   Albumin (g/dL)  Date Value  34/19/3790 2.8 (L)   Phosphorus (mg/dL)  Date Value  24/09/7351 3.6   Sodium (mmol/L)  Date Value  04/18/2020 133 (L)   Assessment: Pt is a 75 y/o M who presented with falls. Pt presented with weakness and dizziness. Pt had fallen multiple times. PMH includes CVA and alcohol abuse. Patient has acute renal failure secondary to ATN and severe hyponatremia likely due to beer potomania. Pharmacy has been consulted to assist with electrolyte management and replacement as indicated.   if sodium rises > 4 mEq/L over 2 hours or > 6 mEq/L over 4 hours - Pharmacy will evaluate and contact MD per consult.   Creatinine: Scr 1.65>1.5>1.75>1.09>0.95  Fluids: - Hypertonic saline 3% CI @ 48mL/hr    Date    Time    Sodium 0323    2222       Na 111 0323    2326       Na 112 0324    0406       Na 115 0324    1430       Na 117 0324 1835    Na 120 0324 2353    Na 119 0325 0420    Na 123 0325 0958    Na 123 0325 1615    Na 125 0325 2030    Na 128 0326 0030    Na 130 0326 0440    Na 130 0326 0739       129  0326 1146    134    Nutrition: Tube feeds @ 50 mL/hr restarted 3/25. Prosource feeds daily restarting 3/26   Goal of Therapy:  Electrolytes within normal limits  Plan:  - K 4.8  Mag 1.9  Phos 3.6  Na 133  Scr 0.79 --- 3% saline drip discontinued 3/26 -- no electrolyte replacement at this time - Monitor electrolytes with AM labs  -nephrology following  Bari Mantis PharmD Clinical Pharmacist 04/18/2020

## 2020-04-18 NOTE — Progress Notes (Signed)
NAMEVisente Arnold, MRN:  [id number], DOB:  1945-08-20, LOS: 4 ADMISSION DATE:  04/17/2020, INITIAL CONSULTATION DATE: 04/13/2020 REFERRING MD: Dr. Charna Archer, CHIEF COMPLAINT: Falls  History of Present Illness:  This is a 75 yo male with a hx of current ETOH Abuse drinks unknown amount of alcohol daily.  He arrived to [facility] ER on 03/23 via EMS from a local motel where he has been residing over the past 2 weeks following multiple falls, and was  subsequently found crawling on the ground by the Field seismologist.  Upon EMS arrival pt noted to have multiple empty beer bottles around his room, although he reported he did not hav e a drink today.  Pt initially refused transport to the ER for treatment, therefore IVC paperwork completed due to concern of pts inability to care for himself although pt not suicidal or homicidal.    ED course Upon arrival to the ER pt reported he had a stroke 3 yrs ago resulting in frequent falls along with "some intermittent trouble" with his right side.  Lab results revealed Na+ 110, chloride 72, CO2 20, glucose 102, BUN 28, creatinine 1.65, anion gap 18, troponin 24, wbc 11.2, and alcohol level <10.  CXR negative for active cardiopulmonary disease, but concerning for artifact vs. nondisplaced fracture of the anterior left fifth rib.  Due to severe hyponatremia ER physician consulted on call Nephrologist Dr. Holley Raring who recommended placing pt on hypertonic 3% saline infusion.  PCCM contacted for ICU admission.    Pertinent  Medical History  Stroke Current ETOH Abuse  Tobacco Abuse   Significant Hospital Events: Including procedures, antibiotic start and stop dates in addition to other pertinent events   03/23: Pt admitted to ICU with severe hyponatremia requiring 3% hypertonic saline infusion  03/27: Remains intubated, mechanically ventilated  Interim History / Subjective:  Sedated, intubated.  On wake up assessment still encephalopathic.  Still requiring high FiO2.  Objective    Blood pressure (!) 158/73, pulse (!) 102, temperature 98.6 F (37 C), temperature source Axillary, resp. rate 14, height _0  (1.803 m), weight 58.6 kg, SpO2 100 %.    Vent Mode: PCV FiO2 (%):  [100 %] 100 % Set Rate:  [16 bmp-18 bmp] 18 bmp Vt Set:  [500 mL] 500 mL PEEP:  [5 cmH20-10 cmH20] 10 cmH20 Pressure Support:  [10 cmH20] 10 cmH20 Plateau Pressure:  [17 cmH20-22 cmH20] 22 cmH20   Intake/Output Summary (Last 24 hours) at 04/18/2020 0943 Last data filed at 04/18/2020 0600 Gross per 24 hour  Intake 2062.78 ml  Output 1300 ml  Net 762.78 ml   Filed Weights   04/15/20 0000 04/15/20 0426 04/18/20 0500  Weight: 57.3 kg 57.3 kg 58.6 kg    Examination: General: acutely ill disheveled frail male, intubated mechanically ventilated  HENT: supple, no JVD  Lungs: Synchronous with vent, diminished breath sounds on the right base, rhonchorous otherwise Cardiovascular: nsr, rrr, no R/G, 2+ radial/DP absent on right foot Abdomen: +BS x4, soft, non tender, non distended  Extremities: Muscle wasting throughout, gangrenous/ischemic toes on the right foot appears to have acute on chronic ischemia Skin: forehead abrasion, posterior scalp abrasion, poor skin turgor throughout, friable skin Neuro: alert and oriented, follows commands, PERRLA GU: Foley in place  Labs/imaging that I havepersonally reviewed   03/23: Na+ 110, Chloride 72, CO2 20, glucose 102, BUN 28, creatinine 1.65, anion gap 18, troponin 24, wbc 11.2, alcohol level <10, and EKG normal sinus rhythm no signs of ischemia  03/23:  CXR negative for active cardiopulmonary disease, but concerning for artifact vs. nondisplaced fracture of the anterior left fifth rib 03/23: CT Head Cervical Spine revealed no acute cervical spine fracture. Multilevel spondylosis and facet hypertrophy greatest from C4-5 through C6-7. 03/27: Reviewed all available data for today in addition reviewed chest imaging as below:       Patient has severe  consolidation of the right lower lobe.  Resolved Hospital Problem list   N/A  Assessment & Plan:   Acute Hypoxic Respiratory Failure Patient required intubation for altered mental status leading to poor pulmonary toilet Intubation required post induction pressor support ETT/MV per protocol  Was allowed to SAT this morning, but oxygen requirement precluded SBT On SAT he is still clearly encephalopathic Severe consolidation of the right lower lobe cannot exclude obstructing lesion Patient still with very high FiO2 requirements Would benefit from bronchoscopy if FiO2 requirements allow CXR in the morning  Severe hyponatremia likely secondary to beer potomania  Other possibilities include malignancy in particular small cell cancer Continuous telemetry monitoring  Nephrology consulted appreciate input Got 3% saline transiently Continue serial sodiums    Mildly elevated troponin likely secondary to demand ischemia in the setting of severe hyponatremia  Trend troponin's Able to discontinue dopamine WBC trend is down   Acute renal failure with anion gap metabolic acidosis secondary to ATN Electrolyte derangements correcting Trend BMP  Replaced electrolytes as indicated  Monitor UOP  Avoid nephrotoxic medications   Mild leukocytosis with no s/sx of infection  Trend WBC and monitor fever curve  No indication for abx at this time  Protein malnutrition Will consult dietitian appreciate input   Acute metabolic encephalopathy-CT Head negative for acute intracranial process   Frequent falls, suspect cerebellar dysfunction due to alcohol abuse Hx: Current everyday ETOH abuse and tobacco abuse  CIWA suspended Will continue high dose thiamine  Urine drug screen pending ETOH abuse and smoking cessation counseling provided Maintain fall precautions  Will consult transition care team; pt also homeless   Best practice (evaluated daily)  Diet:  Tube Feed  Pain/Anxiety/Delirium  protocol (if indicated): Yes (RASS goal -2) VAP protocol (if indicated): Yes DVT prophylaxis: LMWH GI prophylaxis: N/A and PPI Glucose control:  SSI No Central venous access:  N/A Arterial line:  N/A Foley:  Yes, and it is still needed Mobility:  bed rest  PT consulted: N/A Last date of multidisciplinary goals of care discussion: Due 3/28.  Have placed palliative care consultation.  Patient has very poor prognosis overall. Code Status:  full code Disposition: ICU  Labs   CBC: Recent Labs  Lab 03/27/2020 2039 03/31/2020 2222 04/15/20 0406 04/16/20 0420 04/17/20 0440 04/18/20 0441  WBC 11.2* 10.0 11.0* 6.9 12.3* 9.8  NEUTROABS 9.4*  --   --   --   --   --   HGB 13.5 13.6 13.5 10.1* 10.4* 10.5*  HCT RESULTS UNAVAILABLE DUE TO INTERFERING SUBSTANCE RESULTS UNAVAILABLE DUE TO INTERFERING SUBSTANCE RESULTS UNAVAILABLE DUE TO INTERFERING SUBSTANCE 27.7* 30.0* 31.7*  MCV RESULTS UNAVAILABLE DUE TO INTERFERING SUBSTANCE RESULTS UNAVAILABLE DUE TO INTERFERING SUBSTANCE RESULTS UNAVAILABLE DUE TO INTERFERING SUBSTANCE 90.5 94.6 98.8  PLT 230 220 218 152 187 169    Basic Metabolic Panel: Recent Labs  Lab 04/20/2020 2039 04/17/2020 2222 04/07/2020 2326 04/15/20 0406 04/15/20 0951 04/15/20 1430 04/16/20 0420 04/16/20 0958 04/17/20 0440 04/17/20 0739 04/17/20 1146 04/17/20 2010 04/18/20 0441  NA 110* 111*   < > 115*  --    < > 123*  123*   < > 130* 129* 134* 132* 133*  K 4.6  --   --  5.6* 3.5  --  4.1  --  4.4  --   --   --  4.8  CL 72*  --   --  77*  --   --  93*  --  97*  --   --   --  101  CO2 20*  --   --  24  --   --  23  --  27  --   --   --  27  GLUCOSE 102*  --   --  80  --   --  208*  --  151*  --   --   --  119*  BUN 28*  --   --  32*  --   --  25*  --  26*  --   --   --  32*  CREATININE 1.65* 1.50*  --  1.75*  --   --  1.09  --  0.95  --   --   --  0.79  CALCIUM 8.9  --   --  9.1  --   --  8.0*  --  8.4*  --   --   --  8.6*  MG  --  2.1  --  2.1  --   --  1.8  --  1.8  --    --   --  1.9  PHOS  --  3.9  --  4.6  --   --  3.5  --  3.4  --   --   --  3.6   < > = values in this interval not displayed.   GFR: Estimated Creatinine Clearance: 68.2 mL/min (by C-G formula based on SCr of 0.79 mg/dL). Recent Labs  Lab 04/15/20 0406 04/15/20 0622 04/15/20 0951 04/16/20 0420 04/17/20 0440 04/17/20 1145 04/18/20 0441  PROCALCITON  --   --   --   --   --  0.33 0.53  WBC 11.0*  --   --  6.9 12.3*  --  9.8  LATICACIDVEN  --  1.8 0.9  --   --   --   --     Liver Function Tests: Recent Labs  Lab 04/15/2020 2039 04/15/20 0406 04/16/20 0420 04/17/20 0440 04/18/20 0441  AST 75* 84*  --   --  24  ALT 30 29  --   --  16  ALKPHOS 99 100  --   --  48  BILITOT 1.3* 1.2  --   --  0.5  PROT 7.0 6.8  --   --  5.6*  ALBUMIN 3.9 3.9 3.5 3.3* 2.8*   No results for input(s): LIPASE, AMYLASE in the last 168 hours. No results for input(s): AMMONIA in the last 168 hours.  ABG    Component Value Date/Time   PHART 7.29 (L) 04/18/2020 0500   PCO2ART 56 (H) 04/18/2020 0500   PO2ART 85 04/18/2020 0500   HCO3 26.9 04/18/2020 0500   ACIDBASEDEF 0.4 04/18/2020 0500   O2SAT 95.1 04/18/2020 0500     Coagulation Profile: No results for input(s): INR, PROTIME in the last 168 hours.  Cardiac Enzymes: No results for input(s): CKTOTAL, CKMB, CKMBINDEX, TROPONINI in the last 168 hours.  HbA1C: No results found for: HGBA1C  CBG: Recent Labs  Lab 04/17/20 1548 04/17/20 1940 04/17/20 2332 04/18/20 0419 04/18/20 0737  GLUCAP 102* 120* 158* 122* 136*  Review of Systems: Positives in BOLD   Patient is intubated and unable to provide review of systems    Allergies No Known Allergies   Medications: Scheduled Meds:  chlorhexidine gluconate (MEDLINE KIT)  15 mL Mouth Rinse BID   docusate  100 mg Per Tube BID   enoxaparin (LOVENOX) injection  40 mg Subcutaneous Q24H   feeding supplement (PROSource TF)  45 mL Per Tube Daily   folic acid  1 mg Per Tube Daily    free water  30 mL Per Tube Q4H   influenza vaccine adjuvanted  0.5 mL Intramuscular Tomorrow-1000   insulin aspart  0-9 Units Subcutaneous Q4H   mouth rinse  15 mL Mouth Rinse 10 times per day   methylPREDNISolone (SOLU-MEDROL) injection  40 mg Intravenous Q8H   multivitamin with minerals  1 tablet Per Tube Daily   mupirocin ointment  1 application Nasal BID   pantoprazole sodium  40 mg Per Tube Daily   polyethylene glycol  17 g Per Tube Daily   sodium chloride flush  10-40 mL Intracatheter Q12H   thiamine injection  100 mg Intravenous Q24H   Continuous Infusions:  sodium chloride     sodium chloride 100 mL/hr at 04/18/20 0600   ceFEPime (MAXIPIME) IV 2 g (04/18/20 1402)   DOPamine Stopped (04/17/20 2139)   feeding supplement (VITAL AF 1.2 CAL) 1,000 mL (04/18/20 0013)   HYDROmorphone 0.5 mg/hr (04/18/20 1205)   midazolam 4 mg/hr (04/18/20 1841)   norepinephrine (LEVOPHED) Adult infusion 2 mcg/min (04/18/20 0600)   [START ON 04/19/2020] vancomycin     PRN Meds:.acetaminophen, docusate sodium, HYDROmorphone (DILAUDID) injection, midazolam, ondansetron (ZOFRAN) IV, oxyCODONE-acetaminophen, polyethylene glycol, sodium chloride flush    The patient is critically ill with multiple organ systems failure and requires high complexity decision making for assessment and support, frequent evaluation and titration of therapies, application of advanced monitoring technologies and extensive interpretation of multiple databases. Critical Care Time devoted to patient care services described in this note is 40 minutes.   Critical care time: 40 minutes     C. Derrill Kay, MD [facility]   *This note was dictated using voice recognition software/Dragon.  Despite best efforts to proofread, errors can occur which can change the meaning.  Any change was purely unintentional.

## 2020-04-18 NOTE — Progress Notes (Signed)
[facility]  ROUNDING NOTE   Subjective:   Patient seen and evaluated at bedside. Patient remains intubated. Patient is unable to offer any complaints  Objective:  Vital signs in last 24 hours:  Temp:  [98.2 F (36.8 C)-99 F (37.2 C)] 98.6 F (37 C) (03/27 0400) Pulse Rate:  [51-102] 102 (03/27 0730) Resp:  [4-28] 14 (03/27 0730) BP: (82-182)/(37-73) 158/73 (03/27 0730) SpO2:  [89 %-100 %] 100 % (03/27 0730) FiO2 (%):  [100 %] 100 % (03/27 0801) Weight:  [58.6 kg] 58.6 kg (03/27 0500)  Weight change:  Filed Weights   04/15/20 0000 04/15/20 0426 04/18/20 0500  Weight: 57.3 kg 57.3 kg 58.6 kg    Intake/Output: I/O last 3 completed shifts: In: 2062.8 [I.V.:1361.6; IV Piggyback:701.2] Out: 1930 [Urine:1930]   Intake/Output this shift:  No intake/output data recorded.  Physical Exam: General:  Critically ill-appearing  Head:  Endotracheal tube in place  Eyes:  Anicteric  Neck:  Supple  Lungs:   Scattered rhonchi, vent assisted  Heart:  S1S2 no rubs  Abdomen:   Soft, nontender, bowel sounds present  Extremities:  No peripheral edema.  Neurologic:  Intubated, not following commands  Skin:  No acute rash.  Access:  No dialysis access    Basic Metabolic Panel: Recent Labs  Lab 04/21/2020 2039 04/09/2020 2222 04/08/2020 2326 04/15/20 0406 04/15/20 0951 04/15/20 1430 04/16/20 0420 04/16/20 0958 04/17/20 0440 04/17/20 0739 04/17/20 1146 04/17/20 2010 04/18/20 0441  NA 110* 111*   < > 115*  --    < > 123*  123*   < > 130* 129* 134* 132* 133*  K 4.6  --   --  5.6* 3.5  --  4.1  --  4.4  --   --   --  4.8  CL 72*  --   --  77*  --   --  93*  --  97*  --   --   --  101  CO2 20*  --   --  24  --   --  23  --  27  --   --   --  27  GLUCOSE 102*  --   --  80  --   --  208*  --  151*  --   --   --  119*  BUN 28*  --   --  32*  --   --  25*  --  26*  --   --   --  32*  CREATININE 1.65* 1.50*  --  1.75*  --   --  1.09  --  0.95  --   --   --  0.79  CALCIUM  8.9  --   --  9.1  --   --  8.0*  --  8.4*  --   --   --  8.6*  MG  --  2.1  --  2.1  --   --  1.8  --  1.8  --   --   --  1.9  PHOS  --  3.9  --  4.6  --   --  3.5  --  3.4  --   --   --  3.6   < > = values in this interval not displayed.    Liver Function Tests: Recent Labs  Lab 04/01/2020 2039 04/15/20 0406 04/16/20 0420 04/17/20 0440 04/18/20 0441  AST 75* 84*  --   --  24  ALT 30  29  --   --  16  ALKPHOS 99 100  --   --  48  BILITOT 1.3* 1.2  --   --  0.5  PROT 7.0 6.8  --   --  5.6*  ALBUMIN 3.9 3.9 3.5 3.3* 2.8*   No results for input(s): LIPASE, AMYLASE in the last 168 hours. No results for input(s): AMMONIA in the last 168 hours.  CBC: Recent Labs  Lab 04/17/2020 2039 04/10/2020 2222 04/15/20 0406 04/16/20 0420 04/17/20 0440 04/18/20 0441  WBC 11.2* 10.0 11.0* 6.9 12.3* 9.8  NEUTROABS 9.4*  --   --   --   --   --   HGB 13.5 13.6 13.5 10.1* 10.4* 10.5*  HCT RESULTS UNAVAILABLE DUE TO INTERFERING SUBSTANCE RESULTS UNAVAILABLE DUE TO INTERFERING SUBSTANCE RESULTS UNAVAILABLE DUE TO INTERFERING SUBSTANCE 27.7* 30.0* 31.7*  MCV RESULTS UNAVAILABLE DUE TO INTERFERING SUBSTANCE RESULTS UNAVAILABLE DUE TO INTERFERING SUBSTANCE RESULTS UNAVAILABLE DUE TO INTERFERING SUBSTANCE 90.5 94.6 98.8  PLT 230 220 218 152 187 196    Cardiac Enzymes: No results for input(s): CKTOTAL, CKMB, CKMBINDEX, TROPONINI in the last 168 hours.  BNP: Invalid input(s): POCBNP  CBG: Recent Labs  Lab 04/17/20 1548 04/17/20 1940 04/17/20 2332 04/18/20 0419 04/18/20 0737  GLUCAP 102* 120* 158* 122* 136*    Microbiology: Results for orders placed or performed during the hospital encounter of 04/16/2020  Resp Panel by RT-PCR (Flu A&B, Covid) Nasopharyngeal Swab     Status: None   Collection Time: 04/18/2020 10:22 PM   Specimen: Nasopharyngeal Swab; Nasopharyngeal(NP) swabs in vial transport medium  Result Value Ref Range Status   SARS Coronavirus 2 by RT PCR NEGATIVE NEGATIVE Final     Comment: (NOTE) SARS-CoV-2 target nucleic acids are NOT DETECTED.  The SARS-CoV-2 RNA is generally detectable in upper respiratory specimens during the acute phase of infection. The lowest concentration of SARS-CoV-2 viral copies this assay can detect is 138 copies/mL. A negative result does not preclude SARS-Cov-2 infection and should not be used as the sole basis for treatment or other patient management decisions. A negative result may occur with  improper specimen collection/handling, submission of specimen other than nasopharyngeal swab, presence of viral mutation(s) within the areas targeted by this assay, and inadequate number of viral copies(<138 copies/mL). A negative result must be combined with clinical observations, patient history, and epidemiological information. The expected result is Negative.  Fact Sheet for Patients:  EntrepreneurPulse.com.au  Fact Sheet for Healthcare Providers:  IncredibleEmployment.be  This test is no t yet approved or cleared by the [place] FDA and  has been authorized for detection and/or diagnosis of SARS-CoV-2 by FDA under an Emergency Use Authorization (EUA). This EUA will remain  in effect (meaning this test can be used) for the duration of the COVID-19 declaration under Section 564(b)(1) of the Act, 21 U.S.C.section 360bbb-3(b)(1), unless the authorization is terminated  or revoked sooner.       Influenza A by PCR NEGATIVE NEGATIVE Final   Influenza B by PCR NEGATIVE NEGATIVE Final    Comment: (NOTE) The Xpert Xpress SARS-CoV-2/FLU/RSV plus assay is intended as an aid in the diagnosis of influenza from Nasopharyngeal swab specimens and should not be used as a sole basis for treatment. Nasal washings and aspirates are unacceptable for Xpert Xpress SARS-CoV-2/FLU/RSV testing.  Fact Sheet for Patients: EntrepreneurPulse.com.au  Fact Sheet for Healthcare  Providers: IncredibleEmployment.be  This test is not yet approved or cleared by the [place] and has been authorized  for detection and/or diagnosis of SARS-CoV-2 by FDA under an Emergency Use Authorization (EUA). This EUA will remain in effect (meaning this test can be used) for the duration of the COVID-19 declaration under Section 564(b)(1) of the Act, 21 U.S.C. section 360bbb-3(b)(1), unless the authorization is terminated or revoked.  Performed at [facility], [address] [place], [place] [address]   MRSA PCR Screening     Status: Abnormal   Collection Time: 04/01/2020 11:35 PM   Specimen: Nasal Mucosa; Nasopharyngeal  Result Value Ref Range Status   MRSA by PCR POSITIVE (A) NEGATIVE Final    Comment:        The GeneXpert MRSA Assay (FDA approved for NASAL specimens only), is one component of a comprehensive MRSA colonization surveillance program. It is not intended to diagnose MRSA infection nor to guide or monitor treatment for MRSA infections. RESULT CALLED TO, READ BACK BY AND VERIFIED WITH: CINTHIA VEZQUEZ _0  04/15/20 RH Performed at [facility], [address] [place], [place] [address]   Culture, Respiratory w Gram Stain     Status: None (Preliminary result)   Collection Time: 04/18/20 12:38 AM   Specimen: Tracheal Aspirate; Respiratory  Result Value Ref Range Status   Specimen Description   Final    TRACHEAL ASPIRATE Performed at [facility], [address] [place], [place] [address]    Special Requests   Final    NONE Performed at [facility], [address] [place], [place] [address]    Gram Stain   Final    ABUNDANT WBC PRESENT, PREDOMINANTLY PMN NO SQUAMOUS EPITHELIAL CELLS SEEN ABUNDANT GRAM NEGATIVE RODS FEW GRAM POSITIVE COCCI IN PAIRS Performed at [facility], [address] [place], [place] [address]    Culture PENDING  Incomplete   Report Status PENDING   Incomplete    Coagulation Studies: No results for input(s): LABPROT, INR in the last 72 hours.  Urinalysis: No results for input(s): COLORURINE, LABSPEC, PHURINE, GLUCOSEU, HGBUR, BILIRUBINUR, KETONESUR, PROTEINUR, UROBILINOGEN, NITRITE, LEUKOCYTESUR in the last 72 hours.  Invalid input(s): APPERANCEUR    Imaging: CT ANGIO CHEST PE W OR WO CONTRAST  Result Date: 04/17/2020 CLINICAL DATA:  Worsening respiratory failure, chest pain, increased oxygen demand EXAM: CT ANGIOGRAPHY CHEST WITH CONTRAST TECHNIQUE: Multidetector CT imaging of the chest was performed using the standard protocol during bolus administration of intravenous contrast. Multiplanar CT image reconstructions and MIPs were obtained to evaluate the vascular anatomy. CONTRAST:  45m OMNIPAQUE IOHEXOL 350 MG/ML SOLN COMPARISON:  04/17/2020 FINDINGS: Cardiovascular: This is a technically adequate evaluation of the pulmonary vasculature. No filling defects or pulmonary emboli. The heart is unremarkable without pericardial effusion. Normal caliber of the thoracic aorta without dissection. Atherosclerosis of the aortic arch and coronary vessels. Mediastinum/Nodes: Endotracheal tube well above carina. Enteric catheter extends into the gastric lumen. Right-sided PICC tip within the superior vena cava. No pathologic adenopathy. Lungs/Pleura: There is dense right lower lobe consolidation, with relative decreased attenuation of the right lung, with fluid filling the right lower lobe bronchus. There is some volume loss, and this is likely a combination of pneumonia and atelectasis. Dependent consolidation within the right upper lobe and consolidation lateral segment right lower lobe likely reflect atelectasis. Emphysematous changes are seen throughout the left lung, with hyperexpansion. Trace right pleural effusion. No pneumothorax. Upper Abdomen: Indeterminate 2 cm hypodensity right kidney. Hounsfield attenuation is approximately 50. Remainder of the  upper abdomen is unremarkable. Musculoskeletal: There are no acute or destructive  bony lesions. Reconstructed images demonstrate no additional findings. Review of the MIP images confirms the above findings. IMPRESSION: 1. Consolidation throughout the right lung, most pronounced within the right lower lobe, with underlying volume loss. The right lower lobe is completely consolidated and relatively decreased in attenuation, with fluid filling the right lower lobe bronchus, concerning for combination of pneumonia and atelectasis. 2. No evidence of pulmonary embolus. 3. Indeterminate 2 cm hypodensity right kidney. Nonemergent follow-up ultrasound may be useful to assess for cystic versus solid abnormality. 4. Aortic Atherosclerosis (ICD10-I70.0) and Emphysema (ICD10-J43.9). 5. Support devices as above. Electronically Signed   By: Randa Ngo M.D.   On: 04/17/2020 16:58   DG Chest Port 1 View  Result Date: 04/17/2020 CLINICAL DATA:  Increasing oxygen demand EXAM: PORTABLE CHEST 1 VIEW COMPARISON:  04/17/2020 at 0325 hours FINDINGS: Endotracheal tube, enteric tube, and right-sided PICC line remain appropriately positioned. Stable heart size. Atherosclerotic calcification of the aortic knob. Persistent patchy right lower lobe opacity. Blunting of the bilateral costophrenic angles could reflect small bilateral pleural effusions. Multiple skin folds overlie the left hemithorax. Lung markings appear to extend to the periphery of the bilateral hemithoraces. Multiple bilateral rib fractures again seen. IMPRESSION: 1. Multiple skin folds overlying the periphery of the left hemithorax. Lung markings appear to extend to the periphery of the left hemithorax. Recommend repeat chest x-ray after patient repositioning to exclude the possibility of a small left pneumothorax. 2. Persistent patchy right lower lobe opacity concerning for pneumonia. 3. Possible small bilateral pleural effusions. 4. Stable positioning of support lines  and tubes. Electronically Signed   By: Davina Poke D.O.   On: 04/17/2020 12:38   DG Chest Port 1 View  Result Date: 04/17/2020 CLINICAL DATA:  Endotracheal tube EXAM: PORTABLE CHEST 1 VIEW COMPARISON:  04/15/2020 FINDINGS: Endotracheal tube terminates 8.4 cm above the carina. Mild patchy right lower lobe opacity, suspicious for pneumonia, possibly mildly progressive. Mild left basilar opacity, atelectasis versus pneumonia. No pleural effusion or pneumothorax. The heart is normal in size. New right subclavian venous catheter terminates at the cavoatrial junction. Enteric tube courses below the diaphragm. IMPRESSION: Mild patchy right lower lobe opacity, suspicious for pneumonia, mildly progressive. Mild left basilar opacity, atelectasis versus pneumonia. Endotracheal tube terminates 8.4 cm above the carina. New right subclavian venous catheter terminates at the cavoatrial junction. No pneumothorax. Electronically Signed   By: Julian Hy M.D.   On: 04/17/2020 05:53     Medications:   . sodium chloride    . sodium chloride 100 mL/hr at 04/18/20 0600  . ceFEPime (MAXIPIME) IV Stopped (04/18/20 0014)  . dexmedetomidine (PRECEDEX) IV infusion 0.8 mcg/kg/hr (04/18/20 0757)  . DOPamine Stopped (04/17/20 2139)  . feeding supplement (VITAL AF 1.2 CAL) 1,000 mL (04/18/20 0013)  . HYDROmorphone 0.5 mg/hr (04/18/20 0600)  . norepinephrine (LEVOPHED) Adult infusion 2 mcg/min (04/18/20 0600)  . [START ON 04/19/2020] vancomycin     . chlorhexidine gluconate (MEDLINE KIT)  15 mL Mouth Rinse BID  . Chlorhexidine Gluconate Cloth  6 each Topical Q0600  . docusate  100 mg Per Tube BID  . enoxaparin (LOVENOX) injection  40 mg Subcutaneous Q24H  . feeding supplement (PROSource TF)  45 mL Per Tube Daily  . folic acid  1 mg Per Tube Daily  . free water  30 mL Per Tube Q4H  . influenza vaccine adjuvanted  0.5 mL Intramuscular Tomorrow-1000  . insulin aspart  0-9 Units Subcutaneous Q4H  . mouth rinse   15 mL  Mouth Rinse 10 times per day  . methylPREDNISolone (SOLU-MEDROL) injection  40 mg Intravenous Q8H  . multivitamin with minerals  1 tablet Per Tube Daily  . mupirocin ointment  1 application Nasal BID  . polyethylene glycol  17 g Per Tube Daily  . sodium chloride flush  10-40 mL Intracatheter Q12H  . thiamine injection  100 mg Intravenous Q24H   acetaminophen, docusate sodium, HYDROmorphone (DILAUDID) injection, midazolam, ondansetron (ZOFRAN) IV, oxyCODONE-acetaminophen, polyethylene glycol, sodium chloride flush  Assessment/ Plan:  75 y.o. male with a PMHx of prior history of CVA, alcohol abuse, tobacco abuse, who was admitted to [facility] on 04/04/2020 for evaluation of multiple family history and alterations in sensorium.   1.  Renal Patient was admitted with acute kidney injury AKI secondary to prerenal/ATN Patient had a peak creatinine of 1.75 Patient creatinine is now better   2. hyponatremia.   Patient hyponatremia was most likely multifactorial Alcohol abuse/ volume depletion  Patient's initial sodium was 110. Patient was started on 3% saline  Patient sodium is now much better  Patient sodium was 110 on March 23 and is now 80 on March 27 So patient has had 23 mEq change in 4 days The patient has had less than 8 mEq change of sodium in past 4 days    2.  Hyperkalemia.   Patient had hyperkalemia earlier Patient potassium is now better   3.  Hypotension.   Patient requires vasopressors   4.  Alcohol abuse.  Patient currently on sedation.     5.  Anemia of chronic disease Patient hemoglobin stable at 10.4 Patient  earlier had hemoglobin of 13.5 by the time of presentation this was most likely hemoconcentration  Plan  Will continue current tx     LOS: 4 Manpreet s [facility] 3/27/20228:28 AM

## 2020-04-18 NOTE — Consult Note (Signed)
[facility] Vascular Consult Note  MRN : [id number]  Adam Arnold is a 75 y.o. (1945/09/15) male who presents with chief complaint of  Chief Complaint  Patient presents with  . Fall  .  History of Present Illness: 13 yom- full PMH unknown.H/O stroke. +tobacco use Admitted after being found down in a motel on 3/23. Reportedly significant ETOH use. Found to have hyponatremia; metabolic acidosis and respiratory failure. Intubated, placed on vasopressors. 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  Current Facility-Administered Medications  Medication Dose Route Frequency Provider Last Rate Last Admin  . 0.9 %  sodium chloride infusion  250 mL Intravenous Continuous Nelle Don, MD      . 0.9 %  sodium chloride infusion   Intravenous Continuous Rust-Chester, Britton L, NP 100 mL/hr at 04/18/20 0600 Infusion Verify at 04/18/20 0600  . acetaminophen (TYLENOL) tablet 650 mg  650 mg Oral Q4H PRN Awilda Bill, NP   650 mg at 04/01/2020 2244  . ceFEPIme (MAXIPIME) 2 g in sodium chloride 0.9 % 100 mL IVPB  2 g Intravenous Q8H Tyler Pita, MD 200 mL/hr at 04/18/20 1402 2 g at 04/18/20 1402  . chlorhexidine gluconate (MEDLINE KIT) (PERIDEX) 0.12 % solution 15 mL  15 mL Mouth Rinse BID Nelle Don, MD   15 mL at 04/18/20 0756  . Chlorhexidine Gluconate Cloth 2 % PADS 6 each  6 each Topical Q0600 Awilda Bill, NP   6 each at 04/18/20 0504  . docusate (COLACE) 50 MG/5ML liquid 100 mg  100 mg Per Tube BID Darel Hong D, NP   100 mg at 04/18/20 1136  . docusate sodium (COLACE) capsule 100 mg  100 mg Oral BID PRN Awilda Bill, NP      . DOPamine (INTROPIN) 800 mg in dextrose 5 % 250 mL (3.2 mg/mL) infusion  2.5 mcg/kg/min Intravenous Continuous Nelle Don, MD   Paused at 04/17/20 2139  . enoxaparin (LOVENOX) injection 40 mg  40 mg Subcutaneous Q24H Nelle Don, MD   40 mg at 04/17/20 2149  . feeding supplement (PROSource TF) liquid 45 mL  45 mL Per  Tube Daily Nelle Don, MD   45 mL at 04/18/20 1138  . feeding supplement (VITAL AF 1.2 CAL) liquid 1,000 mL  1,000 mL Per Tube Continuous Nelle Don, MD 50 mL/hr at 04/18/20 0013 1,000 mL at 04/18/20 0013  . folic acid (FOLVITE) tablet 1 mg  1 mg Per Tube Daily Nelle Don, MD   1 mg at 04/18/20 1137  . free water 30 mL  30 mL Per Tube Q4H Nelle Don, MD   30 mL at 04/18/20 1000  . HYDROmorphone (DILAUDID) 50 mg in sodium chloride 0.9 % 100 mL (0.5 mg/mL) infusion  0.5 mg/hr Intravenous Continuous Rust-Chester, Britton L, NP 1 mL/hr at 04/18/20 0600 0.5 mg/hr at 04/18/20 0600  . HYDROmorphone (DILAUDID) injection 1 mg  1 mg Intravenous Q2H PRN Nelle Don, MD   1 mg at 04/18/20 1131  . influenza vaccine adjuvanted (FLUAD) injection 0.5 mL  0.5 mL Intramuscular Tomorrow-1000 Awilda Bill, NP      . insulin aspart (novoLOG) injection 0-9 Units  0-9 Units Subcutaneous Q4H Nelle Don, MD   1 Units at 04/18/20 [phone]  . MEDLINE mouth rinse  15 mL Mouth Rinse 10 times per day Nelle Don, MD   15 mL at 04/18/20 1038  . methylPREDNISolone sodium succinate (SOLU-MEDROL) 40 mg/mL injection 40 mg  40 mg Intravenous  Q8H Nelle Don, MD   40 mg at 04/18/20 1610  . midazolam (VERSED) 50 mg/50 mL (1 mg/mL) premix infusion  0.5-10 mg/hr Intravenous Continuous Tyler Pita, MD 2 mL/hr at 04/18/20 1353 2 mg/hr at 04/18/20 1353  . midazolam (VERSED) injection 1 mg  1 mg Intravenous Q2H PRN Darel Hong D, NP   1 mg at 04/18/20 1130  . multivitamin with minerals tablet 1 tablet  1 tablet Per Tube Daily Nelle Don, MD   1 tablet at 04/18/20 1137  . mupirocin ointment (BACTROBAN) 2 % 1 application  1 application Nasal BID Awilda Bill, NP   1 application at 96/04/54 1137  . norepinephrine (LEVOPHED) 69m in 2570mpremix infusion  2-10 mcg/min Intravenous Titrated BrNelle DonMD 7.5 mL/hr at 04/18/20 0600 2 mcg/min at 04/18/20 0600  . ondansetron (ZOFRAN)  injection 4 mg  4 mg Intravenous Q6H PRN BlAwilda BillNP      . oxyCODONE-acetaminophen (PERCOCET/ROXICET) 5-325 MG per tablet 1-2 tablet  1-2 tablet Oral Q6H PRN BlAwilda BillNP      . polyethylene glycol (MIRALAX / GLYCOLAX) packet 17 g  17 g Oral Daily PRN BlAwilda BillNP      . polyethylene glycol (MIRALAX / GLYCOLAX) packet 17 g  17 g Per Tube Daily KeDarel Hong, NP   17 g at 04/18/20 1137  . sodium chloride flush (NS) 0.9 % injection 10-40 mL  10-40 mL Intracatheter Q12H BrNelle DonMD   10 mL at 04/18/20 1136  . sodium chloride flush (NS) 0.9 % injection 10-40 mL  10-40 mL Intracatheter PRN BrNelle DonMD      . thiamine (B-1) injection 100 mg  100 mg Intravenous Q24H Rust-Chester, BrHuel CoteNP      . [SDerrill MemoN 04/19/2020] vancomycin (VANCOREADY) IVPB 1000 mg/200 mL  1,000 mg [facility]venous Q24H [facility]      Past [facility] History:  [facility]  [facility]    Past Surgical History:  Procedure Laterality Date  . HERNIA REPAIR      Social History Social History   Tobacco Use  . Smoking status: Current Every Day Smoker    Packs/day: 1.00    Types: Cigarettes  . Smokeless tobacco: Never Used  Substance Use Topics  . Alcohol use: Yes    Comment: 5-6 beers per day  . Drug use: Not Currently    Family History History reviewed. No pertinent family history.  No Known Allergies   REVIEW OF SYSTEMS (Negative unless checked) * UNABLE TO OBTAIN BASED UPON PATIENTS CRITICAL CONDITION Constitutional: [] Weight loss  [] Fever  [] Chills Cardiac: [] Chest pain   [] Chest pressure   [] Palpitations   [] Shortness of breath when laying flat   [] Shortness of breath at rest   [] Shortness of breath with exertion. Vascular:  [] Pain in legs with walking   [] Pain in legs at rest   [] Pain in legs when laying flat   [] Claudication   [] Pain in feet when walking  [] Pain in feet at rest  [] Pain in feet when laying flat   [] History of DVT   [] Phlebitis    [] Swelling in legs   [] Varicose veins   [] Non-healing ulcers Pulmonary:   [] Uses home oxygen   [] Productive cough   [] Hemoptysis   [] Wheeze  [] COPD   [] Asthma Neurologic:  [] Dizziness  [] Blackouts   [] Seizures   [] History of stroke   [] History of TIA  [] Aphasia   []   Temporary blindness   [] Dysphagia   [] Weakness or numbness in arms   [] Weakness or numbness in legs Musculoskeletal:  [] Arthritis   [] Joint swelling   [] Joint pain   [] Low back pain Hematologic:  [] Easy bruising  [] Easy bleeding   [] Hypercoagulable state   [] Anemic  [] Hepatitis Gastrointestinal:  [] Blood in stool   [] Vomiting blood  [] Gastroesophageal reflux/heartburn   [] Difficulty swallowing. Genitourinary:  [] Chronic kidney disease   [] Difficult urination  [] Frequent urination  [] Burning with urination   [] Blood in urine Skin:  [] Rashes   [] Ulcers   [] Wounds Psychological:  [] History of anxiety   []  History of major depression.  Physical Examination  Vitals:   04/18/20 0930 04/18/20 1000 04/18/20 1130 04/18/20 1200  BP: (!) 147/61 (!) 178/65 (!) 161/70 (!) 163/70  Pulse: (!) 56 (!) 57 61 (!) 54  Resp: 18 18 18 18   Temp:      TempSrc:      SpO2: 100% 100% 100% 100%  Weight:      Height:       Body mass index is 18.02 kg/m. Gen:  Intubated, sedated Head: abrasions on forehead Pulmonary:  Good air movement, intubated  Cardiac: Tachy- regular, normal S1, S2. Vascular:  Vessel Right Left  Radial Palpable Palpable          Carotid Palpable, without bruit Palpable, without bruit  Aorta Not palpable N/A  Femoral Palpable Palpable  Popliteal    PT monophasic biphasic  DP - biphasic   Gastrointestinal: soft, non-distended Musculoskeletal: RIGHT lower extremity -warm to ankle; foot cool- forefoot to toes cyanotic; Great toe- dry gangrene- mummified prolonged chronic ischemia. 5th toe with dry gangrene, Toes 2-4 cyanotic/dusky. No edema. Neurologic:treportedly moves all extremities when agitated-none  purposeful.      CBC Lab Results  Component Value Date   WBC 9.8 04/18/2020   HGB 10.5 (L) 04/18/2020   HCT 31.7 (L) 04/18/2020   MCV 98.8 04/18/2020   PLT 196 04/18/2020    BMET    Component Value Date/Time   NA 136 04/18/2020 1135   K 4.8 04/18/2020 0441   CL 101 04/18/2020 0441   CO2 27 04/18/2020 0441   GLUCOSE 119 (H) 04/18/2020 0441   BUN 32 (H) 04/18/2020 0441   CREATININE 0.79 04/18/2020 0441   CALCIUM 8.6 (L) 04/18/2020 0441   GFRNONAA >60 04/18/2020 0441   Estimated Creatinine Clearance: 68.2 mL/min (by C-G formula based on SCr of 0.79 mg/dL).  COAG No results found for: INR, PROTIME  Radiology DG Chest 2 View  Result Date: 04/22/2020 CLINICAL DATA:  75 year old male with weakness.  Fall. EXAM: CHEST - 2 VIEW COMPARISON:  Chest radiograph dated 11/22/2009. FINDINGS: Background of mild emphysema and chronic bronchitic changes. No focal consolidation, pleural effusion or pneumothorax. The cardiac silhouette is within limits. Osteopenia with mild degenerative changes of the spine. Apparent focal area of cortical irregularity of the anterior left fifth rib may be artifactual or represent a nondisplaced fracture. Correlation with point tenderness recommended. IMPRESSION: 1. No active cardiopulmonary disease. 2. Artifact versus a nondisplaced fracture of the anterior left fifth rib. Electronically Signed   By: Anner Crete M.D.   On: 03/24/2020 21:19   CT Head Wo Contrast  Result Date: 04/11/2020 CLINICAL DATA:  [place], facial abrasions EXAM: CT HEAD WITHOUT CONTRAST TECHNIQUE: Contiguous axial images were obtained from the base of the skull through the vertex without intravenous contrast. COMPARISON:  11/22/2009 FINDINGS: Brain: Hypodensities in the bilateral frontal periventricular white matter consistent with chronic  small vessel ischemic changes. Evidence of prior right frontal burr hole with minimal right frontal cortical encephalomalacia. No signs of acute infarct  or hemorrhage. Lateral ventricles and midline structures are unremarkable. No acute extra-axial fluid collections. No mass effect. Vascular: No hyperdense vessel or unexpected calcification. Skull: Prior right frontal burr hole. Remainder of the calvarium is unremarkable. Sinuses/Orbits: Diffuse mucoperiosteal thickening throughout the ethmoid and maxillary sinuses. Other: None. IMPRESSION: 1. No acute intracranial process. 2. Chronic small vessel ischemic changes in the bilateral frontal white matter. Electronically Signed   By: Randa Ngo M.D.   On: 04/20/2020 21:13   CT ANGIO CHEST PE W OR WO CONTRAST  Result Date: 04/17/2020 CLINICAL DATA:  Worsening respiratory failure, chest pain, increased oxygen demand EXAM: CT ANGIOGRAPHY CHEST WITH CONTRAST TECHNIQUE: Multidetector CT imaging of the chest was performed using the standard protocol during bolus administration of intravenous contrast. Multiplanar CT image reconstructions and MIPs were obtained to evaluate the vascular anatomy. CONTRAST:  50m OMNIPAQUE IOHEXOL 350 MG/ML SOLN COMPARISON:  04/17/2020 FINDINGS: Cardiovascular: This is a technically adequate evaluation of the pulmonary vasculature. No filling defects or pulmonary emboli. The heart is unremarkable without pericardial effusion. Normal caliber of the thoracic aorta without dissection. Atherosclerosis of the aortic arch and coronary vessels. Mediastinum/Nodes: Endotracheal tube well above carina. Enteric catheter extends into the gastric lumen. Right-sided PICC tip within the superior vena cava. No pathologic adenopathy. Lungs/Pleura: There is dense right lower lobe consolidation, with relative decreased attenuation of the right lung, with fluid filling the right lower lobe bronchus. There is some volume loss, and this is likely a combination of pneumonia and atelectasis. Dependent consolidation within the right upper lobe and consolidation lateral segment right lower lobe likely reflect  atelectasis. Emphysematous changes are seen throughout the left lung, with hyperexpansion. Trace right pleural effusion. No pneumothorax. Upper Abdomen: Indeterminate 2 cm hypodensity right kidney. Hounsfield attenuation is approximately 50. Remainder of the upper abdomen is unremarkable. Musculoskeletal: There are no acute or destructive bony lesions. Reconstructed images demonstrate no additional findings. Review of the MIP images confirms the above findings. IMPRESSION: 1. Consolidation throughout the right lung, most pronounced within the right lower lobe, with underlying volume loss. The right lower lobe is completely consolidated and relatively decreased in attenuation, with fluid filling the right lower lobe bronchus, concerning for combination of pneumonia and atelectasis. 2. No evidence of pulmonary embolus. 3. Indeterminate 2 cm hypodensity right kidney. Nonemergent follow-up ultrasound may be useful to assess for cystic versus solid abnormality. 4. Aortic Atherosclerosis (ICD10-I70.0) and Emphysema (ICD10-J43.9). 5. Support devices as above. Electronically Signed   By: MRanda NgoM.D.   On: 04/17/2020 16:58   CT Cervical Spine Wo Contrast  Result Date: 04/07/2020 CLINICAL DATA:  [place] facial abrasions EXAM: CT CERVICAL SPINE WITHOUT CONTRAST TECHNIQUE: Multidetector CT imaging of the cervical spine was performed without intravenous contrast. Multiplanar CT image reconstructions were also generated. COMPARISON:  None. FINDINGS: Alignment: Alignment is grossly anatomic. Skull base and vertebrae: No acute fracture. No primary bone lesion or focal pathologic process. Soft tissues and spinal canal: No prevertebral fluid or swelling. No visible canal hematoma. Extensive atherosclerosis at the carotid bifurcations. Disc levels: Prominent spondylosis at C4-5, C5-6, and C6-7, with associated facet hypertrophy. There is bony fusion across the C4-5 facet joints. There is symmetrical neural foraminal  narrowing at C4-5, C5-6, and C6-7. Upper chest: Airway is patent. Mild emphysematous changes at the lung apices. Other: Reconstructed images demonstrate no additional findings. IMPRESSION: 1. No acute  cervical spine fracture. 2. Multilevel spondylosis and facet hypertrophy greatest from C4-5 through C6-7. Electronically Signed   By: Randa Ngo M.D.   On: 04/20/2020 21:16   DG Chest Left Decubitus  Result Date: 04/15/2020 CLINICAL DATA:  Fall.  Evaluate for right pneumothorax. EXAM: CHEST - LEFT DECUBITUS COMPARISON:  04/15/2020 FINDINGS: Endotracheal tube tip remains at the level of clavicles approximately 10 cm above the carina. Nasogastric tube is seen entering the stomach. No right-sided pneumothorax or hemothorax visualized. Mild streaky opacity in the medial right lower lung shows no significant change. No new or worsening areas of pulmonary opacity are seen. Pulmonary hyperinflation again seen. Subacute or chronic right lower lateral rib fracture deformities are again seen. Subacute or chronic fracture deformity of the right lateral 5th rib is also seen. IMPRESSION: Several subacute versus chronic right rib fracture deformities. No evidence of pneumothorax or hemothorax. Stable mild infiltrate in medial right lower lung. High endotracheal tube position again seen. Electronically Signed   By: Marlaine Hind M.D.   On: 04/15/2020 16:05   DG Chest Port 1 View  Result Date: 04/17/2020 CLINICAL DATA:  Increasing oxygen demand EXAM: PORTABLE CHEST 1 VIEW COMPARISON:  04/17/2020 at 0325 hours FINDINGS: Endotracheal tube, enteric tube, and right-sided PICC line remain appropriately positioned. Stable heart size. Atherosclerotic calcification of the aortic knob. Persistent patchy right lower lobe opacity. Blunting of the bilateral costophrenic angles could reflect small bilateral pleural effusions. Multiple skin folds overlie the left hemithorax. Lung markings appear to extend to the periphery of the  bilateral hemithoraces. Multiple bilateral rib fractures again seen. IMPRESSION: 1. Multiple skin folds overlying the periphery of the left hemithorax. Lung markings appear to extend to the periphery of the left hemithorax. Recommend repeat chest x-ray after patient repositioning to exclude the possibility of a small left pneumothorax. 2. Persistent patchy right lower lobe opacity concerning for pneumonia. 3. Possible small bilateral pleural effusions. 4. Stable positioning of support lines and tubes. Electronically Signed   By: Davina Poke D.O.   On: 04/17/2020 12:38   DG Chest Port 1 View  Result Date: 04/17/2020 CLINICAL DATA:  Endotracheal tube EXAM: PORTABLE CHEST 1 VIEW COMPARISON:  04/15/2020 FINDINGS: Endotracheal tube terminates 8.4 cm above the carina. Mild patchy right lower lobe opacity, suspicious for pneumonia, possibly mildly progressive. Mild left basilar opacity, atelectasis versus pneumonia. No pleural effusion or pneumothorax. The heart is normal in size. New right subclavian venous catheter terminates at the cavoatrial junction. Enteric tube courses below the diaphragm. IMPRESSION: Mild patchy right lower lobe opacity, suspicious for pneumonia, mildly progressive. Mild left basilar opacity, atelectasis versus pneumonia. Endotracheal tube terminates 8.4 cm above the carina. New right subclavian venous catheter terminates at the cavoatrial junction. No pneumothorax. Electronically Signed   By: Julian Hy M.D.   On: 04/17/2020 05:53   DG Chest Port 1 View  Result Date: 04/15/2020 CLINICAL DATA:  OG tube placement and post intubation. EXAM: PORTABLE CHEST 1 VIEW COMPARISON:  April 14, 2020 FINDINGS: Interval placement of an endotracheal tube, tip in the proximal trachea approximately 6.5 cm above the carina. Gastric tube courses through the field of view into the upper abdomen. Subtle RIGHT basilar opacity has developed since the previous exam. No lobar consolidation. No sign of  pleural effusion on frontal radiograph. Regional skeletal structures show no acute process. EKG leads project over the chest. IMPRESSION: 1. Interval placement of an endotracheal tube, tip in the proximal trachea approximately 6.5 cm above the carina. 2. Subtle RIGHT  basilar opacity has developed since the previous exam, correlate with any risk for aspiration. 3. Lucency beneath the RIGHT hemidiaphragm not as pronounced as on the previous image. Again this finding may be related to lung projecting behind the diaphragm. See abdominal radiograph the same date for further detail. Electronically Signed   By: Zetta Bills M.D.   On: 04/15/2020 13:49   DG Abd Decub  Result Date: 04/15/2020 CLINICAL DATA:  Fall. Lucency of the right hemidiaphragm on prior abdomen x-ray. EXAM: ABDOMEN - 1 VIEW DECUBITUS COMPARISON:  04/15/2020. FINDINGS: Decubitus view reveals no free air. Lucency noted over the right hemidiaphragm on previous study represents overlying lung. NG tube again noted with tip over stomach. IMPRESSION: No free air. Lucency noted over the right hemidiaphragm previous study represents overlying lung. Electronically Signed   By: Marcello Moores  Register   On: 04/15/2020 15:55   DG Abd Portable 1V  Addendum Date: 04/15/2020   ADDENDUM REPORT: 04/15/2020 14:36 ADDENDUM: These results were called by telephone at the time of interpretation on 04/15/2020 at 2:36 pm to provider [facility] , who verbally acknowledged these results. Electronically Signed   By: Zetta Bills M.D.   On: 04/15/2020 14:36   Result Date: 04/15/2020 CLINICAL DATA:  OG tube placement and intubation. EXAM: PORTABLE ABDOMEN - 1 VIEW COMPARISON:  Previous chest x-rays dated April 14, 2020 FINDINGS: Imaging of the lower chest and upper abdomen shows a gastric tube in place, tip in the mid stomach, side port below the EG junction. Visualized lungs are clear.  EKG leads project over the chest. There is some lucency in the RIGHT upper quadrant  over the liver. Visualized bowel gas pattern nonspecific with gas-filled colonic loops in the LEFT upper quadrant. Regional skeletal structures without sign of acute process. IMPRESSION: 1. The gastric tube is in place with tip in the mid stomach, side port below the EG junction. 2. Lucency in the RIGHT upper quadrant over the liver. This could be related to lung hyperinflation projecting posterior to the RIGHT hemidiaphragm. Would however suggest correlation with decubitus radiograph to exclude the possibility of pneumoperitoneum. 3. A call is out to the referring provider to further discuss findings in the above case. Electronically Signed: By: Zetta Bills M.D. On: 04/15/2020 13:45   [place] EKG SITE RITE  Result Date: 04/15/2020 If Site Rite image not attached, placement could not be confirmed due to current cardiac rhythm.     Assessment/Plan 1. RIGHT foot- Acute on Chronic ischemia- secondary to probable underlying chronic ischemia with poor perfusion from low flow state and vasopressor support. 2. Wean Vasopressors as tolerated; continue supportive care 3. Once hemodynamically stable will perform angiogram to evaluate/treat RIGHT lower extremity. 4. Betadine to RIGHT toes- great and 5th toe will demarcate and possibly require amputation at some point. 5. Will follow.   Evaristo Bury, MD  04/18/2020 2:03 PM    This note was created with Dragon medical transcription system.  Any error is purely unintentional

## 2020-04-18 NOTE — Progress Notes (Signed)
Pharmacy Antibiotic Note  Adam Arnold is a 75 y.o. male admitted on 04/07/2020 with HCAP.  Pharmacy has been consulted for Cefepime and Vancomycin dosing.  Plan: -Will adjust Cefepime from 2 gm q12h to 2 gm q8h per Crcl now 68 ml/min  -Vancomycin Ordered LD of Vanc 1250 mg x 1 per pt wt: 57.3 kg. Vancomycin 1000 mg IV Q 24 hrs.  Goal AUC 400-550. Expected AUC: 475.4 SCr used: 0.95 TBW 57.3 < IBW 75.3  Pharmacy will continue to monitor Vanc levels (if needed) and SCr and will adjust abx doses if warranted.   Height: 5\' 11"  (180.3 cm) Weight: 58.6 kg (129 lb 3 oz) IBW/kg (Calculated) : 75.3  Temp (24hrs), Avg:98.6 F (37 C), Min:98.2 F (36.8 C), Max:99 F (37.2 C)  Recent Labs  Lab 04/06/2020 2222 04/15/20 0406 04/15/20 0622 04/15/20 0951 04/16/20 0420 04/17/20 0440 04/18/20 0441  WBC 10.0 11.0*  --   --  6.9 12.3* 9.8  CREATININE 1.50* 1.75*  --   --  1.09 0.95 0.79  LATICACIDVEN  --   --  1.8 0.9  --   --   --     Estimated Creatinine Clearance: 68.2 mL/min (by C-G formula based on SCr of 0.79 mg/dL).    No Known Allergies  Antimicrobials this admission: 03/26 Cefepime >>  03/26 Vancomycin >>    Microbiology results: 3/27 BCx: Pending 3/23 MRSA PCR: Positive 3/27 trach aspirate:  GNR,  GPC  Thank you for allowing pharmacy to be a part of this patient's care.  4/27 PharmD Clinical Pharmacist 04/18/2020

## 2020-04-19 ENCOUNTER — Inpatient Hospital Stay: Payer: No Typology Code available for payment source

## 2020-04-19 DIAGNOSIS — Z7189 Other specified counseling: Secondary | ICD-10-CM

## 2020-04-19 LAB — GLUCOSE, CAPILLARY
Glucose-Capillary: 187 mg/dL — ABNORMAL HIGH (ref 70–99)
Glucose-Capillary: 183 mg/dL — ABNORMAL HIGH (ref 70–99)
Glucose-Capillary: 157 mg/dL — ABNORMAL HIGH (ref 70–99)
Glucose-Capillary: 183 mg/dL — ABNORMAL HIGH (ref 70–99)
Glucose-Capillary: 216 mg/dL — ABNORMAL HIGH (ref 70–99)

## 2020-04-19 LAB — CBC
HCT: 28.4 % — ABNORMAL LOW (ref 39.0–52.0)
Hemoglobin: 9.5 g/dL — ABNORMAL LOW (ref 13.0–17.0)
MCH: 33.2 pg (ref 26.0–34.0)
MCHC: 33.5 g/dL (ref 30.0–36.0)
MCV: 99.3 fL (ref 80.0–100.0)
Platelets: 192 10*3/uL (ref 150–400)
RBC: 2.86 MIL/uL — ABNORMAL LOW (ref 4.22–5.81)
RDW: 13.8 % (ref 11.5–15.5)
WBC: 16.3 10*3/uL — ABNORMAL HIGH (ref 4.0–10.5)
nRBC: 0 % (ref 0.0–0.2)

## 2020-04-19 LAB — SODIUM
Sodium: 135 mmol/L (ref 135–145)
Sodium: 138 mmol/L (ref 135–145)

## 2020-04-19 LAB — BASIC METABOLIC PANEL
Anion gap: 6 (ref 5–15)
BUN: 42 mg/dL — ABNORMAL HIGH (ref 8–23)
CO2: 27 mmol/L (ref 22–32)
Calcium: 8.5 mg/dL — ABNORMAL LOW (ref 8.9–10.3)
Chloride: 104 mmol/L (ref 98–111)
Creatinine, Ser: 0.87 mg/dL (ref 0.61–1.24)
GFR, Estimated: >60 mL/min (ref >60)
Glucose, Bld: 201 mg/dL — ABNORMAL HIGH (ref 70–99)
Potassium: 4.7 mmol/L (ref 3.5–5.1)
Sodium: 137 mmol/L (ref 135–145)

## 2020-04-19 LAB — MAGNESIUM: Magnesium: 1.7 mg/dL (ref 1.7–2.4)

## 2020-04-19 LAB — PHOSPHORUS: Phosphorus: 2.7 mg/dL (ref 2.5–4.6)

## 2020-04-19 LAB — HEMOGLOBIN A1C
Hgb A1c MFr Bld: 6.8 % — ABNORMAL HIGH (ref 4.8–5.6)
Mean Plasma Glucose: 148.46 mg/dL

## 2020-04-19 LAB — PROCALCITONIN: Procalcitonin: 0.5 ng/mL

## 2020-04-19 MED ORDER — IPRATROPIUM-ALBUTEROL 0.5-2.5 (3) MG/3ML IN SOLN
3.0000 mL | Freq: Four times a day (QID) | RESPIRATORY_TRACT | Status: DC
  Administered 2020-04-19 – 2020-04-23 (×15): 3 mL via RESPIRATORY_TRACT
  Filled 2020-04-19 (×15): qty 3

## 2020-04-19 MED ORDER — IPRATROPIUM-ALBUTEROL 0.5-2.5 (3) MG/3ML IN SOLN
3.0000 mL | RESPIRATORY_TRACT | Status: DC
  Administered 2020-04-19 (×2): 3 mL via RESPIRATORY_TRACT
  Filled 2020-04-19 (×2): qty 3

## 2020-04-19 MED ORDER — METHYLPREDNISOLONE SODIUM SUCC 40 MG IJ SOLR
40.0000 mg | Freq: Two times a day (BID) | INTRAMUSCULAR | Status: DC
  Administered 2020-04-19 – 2020-04-21 (×4): 40 mg via INTRAVENOUS
  Filled 2020-04-19 (×4): qty 1

## 2020-04-19 MED ORDER — VECURONIUM BROMIDE 10 MG IV SOLR
20.0000 mg | Freq: Once | INTRAVENOUS | Status: AC
  Administered 2020-04-19: 20 mg via INTRAVENOUS
  Filled 2020-04-19: qty 20

## 2020-04-19 MED ORDER — SODIUM CHLORIDE 0.9 % IV BOLUS
500.0000 mL | Freq: Once | INTRAVENOUS | Status: AC
  Administered 2020-04-19: 500 mL via INTRAVENOUS

## 2020-04-19 MED ORDER — VANCOMYCIN HCL 1250 MG/250ML IV SOLN
1250.0000 mg | INTRAVENOUS | Status: DC
  Administered 2020-04-20: 1250 mg via INTRAVENOUS
  Filled 2020-04-19: qty 250

## 2020-04-19 MED ORDER — BUDESONIDE 0.5 MG/2ML IN SUSP
0.5000 mg | Freq: Two times a day (BID) | RESPIRATORY_TRACT | Status: DC
  Administered 2020-04-19 – 2020-04-23 (×9): 0.5 mg via RESPIRATORY_TRACT
  Filled 2020-04-19 (×9): qty 2

## 2020-04-19 MED ORDER — PROPOFOL 1000 MG/100ML IV EMUL
5.0000 ug/kg/min | INTRAVENOUS | Status: DC
  Administered 2020-04-19: 20 ug/kg/min via INTRAVENOUS
  Administered 2020-04-20: 14 ug/kg/min via INTRAVENOUS
  Administered 2020-04-20 – 2020-04-22 (×5): 20 ug/kg/min via INTRAVENOUS
  Filled 2020-04-19 (×8): qty 100

## 2020-04-19 NOTE — Progress Notes (Signed)
Adam Arnold, MRN:  [id number], DOB:  [birth year], LOS: 5 ADMISSION DATE:  04/07/2020, INITIAL CONSULTATION DATE: 04/02/2020 REFERRING MD: Dr. Charna Archer, CHIEF COMPLAINT: Falls  History of Present Illness:  75 yo male with a hx of current ETOH Abuse drinks unknown amount of alcohol daily.  He arrived to [facility] ER on 03/23 via EMS from a local motel where he has been residing over the past 2 weeks following multiple falls, and was  subsequently found crawling on the ground by the Field seismologist.  Upon EMS arrival pt noted to have multiple empty beer bottles around his room, although he reported he did not hav e a drink today.  Pt initially refused transport to the ER for treatment, therefore IVC paperwork completed due to concern of pts inability to care for himself although pt not suicidal or homicidal.    ED course Upon arrival to the ER pt reported he had a stroke 3 yrs ago resulting in frequent falls along with "some intermittent trouble" with his right side.  Lab results revealed Na+ 110, chloride 72, CO2 20, glucose 102, BUN 28, creatinine 1.65, anion gap 18, troponin 24, wbc 11.2, and alcohol level <10.  CXR negative for active cardiopulmonary disease, but concerning for artifact vs. nondisplaced fracture of the anterior left fifth rib.  Due to severe hyponatremia ER physician consulted on call Nephrologist Dr. Holley Raring who recommended placing pt on hypertonic 3% saline infusion.  PCCM contacted for ICU admission.    Pertinent  Medical History  Stroke Current ETOH Abuse  Tobacco Abuse   Significant Hospital Events: Including procedures, antibiotic start and stop dates in addition to other pertinent events   03/23: Pt admitted to ICU with severe hyponatremia requiring 3% hypertonic saline infusion  03/27: Remains intubated, mechanically ventilated 3/28 remains intubated, severe hypoxia  Interim History / Subjective:  Remains intubated Severe hypoxia Severe ALI/ARDS +etoh  abuse   Objective   Blood pressure (!) 111/40, pulse (!) 54, temperature 98.6 F (37 C), temperature source Axillary, resp. rate 18, height 5' 11"  (1.803 m), weight 58.6 kg, SpO2 95 %.    Vent Mode: PCV FiO2 (%):  [85 %-100 %] 85 % Set Rate:  [18 bmp] 18 bmp PEEP:  [10 cmH20] 10 cmH20 Pressure Support:  [10 cmH20] 10 cmH20 Plateau Pressure:  [20 cmH20-21 cmH20] 21 cmH20   Intake/Output Summary (Last 24 hours) at 04/19/2020 0759 Last data filed at 04/19/2020 0600 Gross per 24 hour  Intake 915.22 ml  Output 1500 ml  Net -584.78 ml   Filed Weights   04/15/20 0426 04/18/20 0500 04/19/20 0500  Weight: 57.3 kg 58.6 kg 58.6 kg   REVIEW OF SYSTEMS  PATIENT IS UNABLE TO PROVIDE COMPLETE REVIEW OF SYSTEM S DUE TO SEVERE CRITICAL ILLNESS    PHYSICAL EXAMINATION:  GENERAL:critically ill appearing, +resp distress HEAD: Normocephalic, atraumatic.  EYES: Pupils equal, round, reactive to light.  No scleral icterus.  MOUTH: Moist mucosal membrane. NECK: Supple. No thyromegaly. No nodules. No JVD.  PULMONARY: +rhonchi, +wheezing CARDIOVASCULAR: S1 and S2. Regular rate and rhythm. No murmurs, rubs, or gallops.  GASTROINTESTINAL: Soft, nontender, -distended. Positive bowel sounds.  MUSCULOSKELETAL: Muscle wasting throughout, gangrenous/ischemic toes on the right foot appears to have acute on chronic ischemia NEUROLOGIC: obtunded SKIN:intact,warm,dry   Labs/imaging that I havepersonally reviewed   03/23: Na+ 110, Chloride 72, CO2 20, glucose 102, BUN 28, creatinine 1.65, anion gap 18, troponin 24, wbc 11.2, alcohol level <10, and EKG normal sinus rhythm no signs  of ischemia  03/23: CXR negative for active cardiopulmonary disease, but concerning for artifact vs. nondisplaced fracture of the anterior left fifth rib 03/23: CT Head Cervical Spine revealed no acute cervical spine fracture. Multilevel spondylosis and facet hypertrophy greatest from C4-5 through C6-7. 03/27: Reviewed all  available data for today in addition reviewed chest imaging as below:       Patient has severe consolidation of the right lower lobe.  Resolved Hospital Problem list   N/A  Assessment & Plan:   75 yo male with acute and severe ADS and severe hypoxic resp failure from acute RT LUNG PNEUMONIA complicated by DT's and severe ETOH abuse With severe hyponatremia and levated troponin likely secondary to demand ischemia Acute renal failure with anion gap metabolic acidosis secondary to ATN  Severe ACUTE Hypoxic and Hypercapnic Respiratory Failure -continue Mechanical Ventilator support -continue Bronchodilator Therapy -Wean Fio2 and PEEP as tolerated -VAP/VENT bundle implementation ARDS protocol Severe hypoxia unable to wean Vent Mode: PCV FiO2 (%):  [85 %-100 %] 85 % Set Rate:  [18 bmp] 18 bmp PEEP:  [10 cmH20] 10 cmH20 Plateau Pressure:  [20 cmH20-21 cmH20] 21 cmH20 consider bronch    ACUTE KIDNEY INJURY/Renal Failure -continue Foley Catheter-assess need -Avoid nephrotoxic agents -Follow urine output, BMP -Ensure adequate renal perfusion, optimize oxygenation -Renal dose medications Severe hyponatremia likely secondary to beer potomania  Other possibilities include malignancy in particular small cell cancer Continuous telemetry monitoring  Nephrology consulted appreciate input Continue serial sodiums    CARDIAC Mildly elevated troponin likely secondary to demand ischemia in the setting of severe hyponatremia   INFECTIOUS DISEASE -continue antibiotics as prescribed -follow up cultures Anti-infectives (From admission, onward)   Start     Dose/Rate Route Frequency Ordered Stop   04/19/20 0100  vancomycin (VANCOREADY) IVPB 1000 mg/200 mL        1,000 mg 200 mL/hr over 60 Minutes Intravenous Every 24 hours 04/18/20 0118     04/18/20 1200  ceFEPIme (MAXIPIME) 2 g in sodium chloride 0.9 % 100 mL IVPB        2 g 200 mL/hr over 30 Minutes Intravenous Every 8 hours  04/18/20 1032     04/18/20 0015  vancomycin (VANCOREADY) IVPB 1250 mg/250 mL        1,250 mg 166.7 mL/hr over 90 Minutes Intravenous  Once 04/17/20 2319 04/18/20 0232   04/17/20 2345  ceFEPIme (MAXIPIME) 2 g in sodium chloride 0.9 % 100 mL IVPB  Status:  Discontinued        2 g 200 mL/hr over 30 Minutes Intravenous Every 12 hours 04/17/20 2259 04/18/20 1032        GI GI PROPHYLAXIS as indicated NUTRITIONAL STATUS DIET-->TF's as tolerated Constipation protocol as indicated Protein malnutrition Will consult dietitian appreciate input     NEUROLOGY Acute toxic metabolic encephalopathy, need for sedation Goal RASS -2 to -3 CT Head negative for acute intracranial process   Frequent falls, suspect cerebellar dysfunction due to alcohol abuse Current everyday ETOH abuse and tobacco abuse  high dose thiamine   Best practice (evaluated daily)  Diet:  Tube Feed  Pain/Anxiety/Delirium protocol (if indicated): Yes (RASS goal -2) VAP protocol (if indicated): Yes DVT prophylaxis: LMWH GI prophylaxis: N/A and PPI Glucose control:  SSI No Central venous access:  N/A Arterial line:  N/A Foley:  Yes, and it is still needed Mobility:  bed rest  PT consulted: N/A Last date of multidisciplinary goals of care discussion: Due 3/28.  Have placed palliative care  consultation.  Patient has very poor prognosis overall. Code Status:  full code Disposition: ICU    DVT/GI PRX ordered and assessed TRANSFUSIONS AS NEEDED MONITOR FSBS I Assessed the need for Labs I Assessed the need for Foley I Assessed the need for Central Venous Line Family Discussion when available I Assessed the need for Mobilization I made an Assessment of medications to be adjusted accordingly Safety Risk assessment completed  CASE DISCUSSED IN MULTIDISCIPLINARY ROUNDS WITH ICU TEAM        Labs   CBC: Recent Labs  Lab 04/19/2020 2039 04/08/2020 2222 04/15/20 0406 04/16/20 0420 04/17/20 0440 04/18/20 0441  04/19/20 0411  WBC 11.2*   < > 11.0* 6.9 12.3* 9.8 16.3*  NEUTROABS 9.4*  --   --   --   --   --   --   HGB 13.5   < > 13.5 10.1* 10.4* 10.5* 9.5*  HCT RESULTS UNAVAILABLE DUE TO INTERFERING SUBSTANCE   < > RESULTS UNAVAILABLE DUE TO INTERFERING SUBSTANCE 27.7* 30.0* 31.7* 28.4*  MCV RESULTS UNAVAILABLE DUE TO INTERFERING SUBSTANCE   < > RESULTS UNAVAILABLE DUE TO INTERFERING SUBSTANCE 90.5 94.6 98.8 99.3  PLT 230   < > 218 152 187 196 192   < > = values in this interval not displayed.    Basic Metabolic Panel: Recent Labs  Lab 04/15/20 0406 04/15/20 0951 04/15/20 1430 04/16/20 0420 04/16/20 0958 04/17/20 0440 04/17/20 0739 04/18/20 0441 04/18/20 1135 04/18/20 2019 04/19/20 0405 04/19/20 0411  NA 115*  --    < > 123*  123*   < > 130*   < > 133* 136 136 135 137  K 5.6* 3.5  --  4.1  --  4.4  --  4.8  --   --   --  4.7  CL 77*  --   --  93*  --  97*  --  101  --   --   --  104  CO2 24  --   --  23  --  27  --  27  --   --   --  27  GLUCOSE 80  --   --  208*  --  151*  --  119*  --   --   --  201*  BUN 32*  --   --  25*  --  26*  --  32*  --   --   --  42*  CREATININE 1.75*  --   --  1.09  --  0.95  --  0.79  --   --   --  0.87  CALCIUM 9.1  --   --  8.0*  --  8.4*  --  8.6*  --   --   --  8.5*  MG 2.1  --   --  1.8  --  1.8  --  1.9  --   --   --  1.7  PHOS 4.6  --   --  3.5  --  3.4  --  3.6  --   --   --  2.7   < > = values in this interval not displayed.   GFR: Estimated Creatinine Clearance: 62.7 mL/min (by C-G formula based on SCr of 0.87 mg/dL). Recent Labs  Lab 04/15/20 0622 04/15/20 0951 04/16/20 0420 04/17/20 0440 04/17/20 1145 04/18/20 0441 04/19/20 0411  PROCALCITON  --   --   --   --  0.33 0.53 0.50  WBC  --   --  6.9 12.3*  --  9.8 16.3*  LATICACIDVEN 1.8 0.9  --   --   --   --   --     Liver Function Tests: Recent Labs  Lab 03/27/2020 2039 04/15/20 0406 04/16/20 0420 04/17/20 0440 04/18/20 0441  AST 75* 84*  --   --  24  ALT 30 29  --   --  16   ALKPHOS 99 100  --   --  48  BILITOT 1.3* 1.2  --   --  0.5  PROT 7.0 6.8  --   --  5.6*  ALBUMIN 3.9 3.9 3.5 3.3* 2.8*   No results for input(s): LIPASE, AMYLASE in the last 168 hours. No results for input(s): AMMONIA in the last 168 hours.  ABG    Component Value Date/Time   PHART 7.34 (L) 04/19/2020 0512   PCO2ART 49 (H) 04/19/2020 0512   PO2ART 89 04/19/2020 0512   HCO3 26.4 04/19/2020 0512   ACIDBASEDEF 0.4 04/18/2020 0500   O2SAT 96.3 04/19/2020 0512     Coagulation Profile: No results for input(s): INR, PROTIME in the last 168 hours.  Cardiac Enzymes: No results for input(s): CKTOTAL, CKMB, CKMBINDEX, TROPONINI in the last 168 hours.  HbA1C: No results found for: HGBA1C  CBG: Recent Labs  Lab 04/18/20 1547 04/18/20 1920 04/18/20 2310 04/19/20 0339 04/19/20 0730  GLUCAP 170* 183* 194* 187* 183*    Allergies No Known Allergies   Medications: Scheduled Meds: . chlorhexidine gluconate (MEDLINE KIT)  15 mL Mouth Rinse BID  . Chlorhexidine Gluconate Cloth  6 each Topical Daily  . docusate  100 mg Per Tube BID  . enoxaparin (LOVENOX) injection  40 mg Subcutaneous Q24H  . feeding supplement (PROSource TF)  45 mL Per Tube Daily  . folic acid  1 mg Per Tube Daily  . free water  30 mL Per Tube Q4H  . influenza vaccine adjuvanted  0.5 mL Intramuscular Tomorrow-1000  . insulin aspart  0-15 Units Subcutaneous Q4H  . mouth rinse  15 mL Mouth Rinse 10 times per day  . methylPREDNISolone (SOLU-MEDROL) injection  40 mg Intravenous Q8H  . multivitamin with minerals  1 tablet Per Tube Daily  . mupirocin ointment  1 application Nasal BID  . pantoprazole sodium  40 mg Per Tube Daily  . polyethylene glycol  17 g Per Tube Daily  . thiamine injection  100 mg Intravenous Q24H   Continuous Infusions: . sodium chloride    . sodium chloride 75 mL/hr at 04/19/20 0554  . ceFEPime (MAXIPIME) IV 2 g (04/19/20 0400)  . feeding supplement (VITAL AF 1.2 CAL) 1,000 mL (04/18/20  2147)  . HYDROmorphone 0.5 mg/hr (04/18/20 1205)  . midazolam 5 mg/hr (04/18/20 2215)  . vancomycin 1,000 mg (04/19/20 0326)   PRN Meds:.acetaminophen, docusate sodium, midazolam, ondansetron (ZOFRAN) IV, oxyCODONE-acetaminophen, polyethylene glycol, sodium chloride flush    Critical Care Time devoted to patient care services described in this note is 56 minutes.   Overall, patient is critically ill, prognosis is guarded.  Patient with Multiorgan failure and at high risk for cardiac arrest and death.    Corrin Parker, M.D.  [facility]  Medical Director [place] Director [facility] Cardio-Pulmonary Department

## 2020-04-19 NOTE — TOC Progression Note (Signed)
Transition of Care [facility]) - Progression Note    Patient Details  NameZebediah Arnold MRN: [id number] Date of Birth: 1945/09/29  Transition of Care [facility]) CM/SW Contact  Marina Goodell Phone Number: [phone] 04/19/2020, 3:30 PM  Clinical Narrative:     CSW called and left voicemail at [facility], to make a report, due to concerns patient is unable to care for himself.  Expected Discharge Plan: Long Term Nursing Home Barriers to Discharge: Financial Resources,Transportation,Inadequate or no insurance,Unsafe home situation,Continued Medical Work up,Homeless with medical needs  Expected Discharge Plan and Services Expected Discharge Plan: Long Term Nursing Home In-house Referral: Clinical Social Work     Living arrangements for the past 2 months: Homeless                                       Social Determinants of Health (SDOH) Interventions    Readmission Risk Interventions No flowsheet data found.

## 2020-04-19 NOTE — Consult Note (Signed)
Consultation Note Date: 04/19/2020   Patient Name: Adam Arnold  DOB: 01-16-1946  MRN: [id number]  Age / Sex: 74 y.o., male  PCP: Patient, No Pcp Per Referring Physician: Flora Lipps, MD  Reason for Consultation: Establishing goals of care  HPI/Patient Profile: Per EMR, this is a 75 yo male with a hx of current ETOH Abuse drinks unknown amount of alcohol daily.  He arrived to [facility] ER on 03/23 via EMS from a local motel where he has been living over the past 2 weeks. Following multiple falls, he was subsequently found crawling on the ground by the Field seismologist.  Upon EMS arrival pt noted to have multiple empty beer bottles around his room, although he reported he did not hav e a drink today.  Pt initially refused transport to the ER for treatment, therefore IVC paperwork completed due to concern of pts inability to care for himself although pt not suicidal or homicidal.    Clinical Assessment and Goals of Care: Patient is resting in bed on ventilator. No family at bedside. Nobody is listed for a contact under demographics. Notes reviewed.  No previous hospitalizations noted.   Spoke with CCM staff.   He will need to be extubated to participate in a [place] discussion himself.   Would recommend contacting DSS to start process for obtaining a court appointed guardian.      SUMMARY OF RECOMMENDATIONS   Patient is unable to participate in [place] conversation at this time. No person listed in demographics. Would recommend reaching out to DSS to initiate process for a court appointed guardian.       Primary Diagnoses: Present on Admission: . Hyponatremia   I have reviewed the medical record, interviewed the patient and family, and examined the patient. The following aspects are pertinent.  Past Medical History:  Diagnosis Date  . Stroke [facility])    Social History   Socioeconomic History  . Marital status:  Single    Spouse name: Not on file  . Number of children: Not on file  . Years of education: Not on file  . Highest education level: Not on file  Occupational History  . Not on file  Tobacco Use  . Smoking status: Current Every Day Smoker    Packs/day: 1.00    Types: Cigarettes  . Smokeless tobacco: Never Used  Substance and Sexual Activity  . Alcohol use: Yes    Comment: 5-6 beers per day  . Drug use: Not Currently  . Sexual activity: Not on file  Other Topics Concern  . Not on file  Social History Narrative  . Not on file   Social Determinants of Health   Financial Resource Strain: Not on file  Food Insecurity: Not on file  Transportation Needs: Not on file  Physical Activity: Not on file  Stress: Not on file  Social Connections: Not on file   History reviewed. No pertinent family history. Scheduled Meds: . budesonide (PULMICORT) nebulizer solution  0.5 mg Nebulization BID  . chlorhexidine gluconate (MEDLINE KIT)  15 mL Mouth Rinse BID  . Chlorhexidine Gluconate Cloth  6 each Topical Daily  . docusate  100 mg Per Tube BID  . enoxaparin (LOVENOX) injection  40 mg Subcutaneous Q24H  . feeding supplement (PROSource TF)  45 mL Per Tube Daily  . folic acid  1 mg Per Tube Daily  . free water  30 mL Per Tube Q4H  . influenza vaccine adjuvanted  0.5 mL Intramuscular Tomorrow-1000  . insulin aspart  0-15 Units Subcutaneous Q4H  . ipratropium-albuterol  3 mL Nebulization Q4H  . mouth rinse  15 mL Mouth Rinse 10 times per day  . methylPREDNISolone (SOLU-MEDROL) injection  40 mg Intravenous Q12H  . multivitamin with minerals  1 tablet Per Tube Daily  . mupirocin ointment  1 application Nasal BID  . pantoprazole sodium  40 mg Per Tube Daily  . polyethylene glycol  17 g Per Tube Daily  . thiamine injection  100 mg Intravenous Q24H   Continuous Infusions: . sodium chloride    . ceFEPime (MAXIPIME) IV 2 g (04/19/20 1211)  . feeding supplement (VITAL AF 1.2 CAL) 1,000 mL  (04/18/20 2147)  . HYDROmorphone 0.5 mg/hr (04/19/20 1017)  . midazolam 0.5 mg/hr (04/19/20 0958)  . propofol (DIPRIVAN) infusion 20 mcg/kg/min (04/19/20 1204)  . [START ON 04/20/2020] vancomycin     PRN Meds:.acetaminophen, docusate sodium, midazolam, ondansetron (ZOFRAN) IV, oxyCODONE-acetaminophen, polyethylene glycol, sodium chloride flush Medications Prior to Admission:  Prior to Admission medications   Not on File   No Known Allergies Review of Systems  Unable to perform ROS   Physical Exam Pulmonary:     Comments: On ventilator.     Vital Signs: BP (!) 117/41   Pulse 98   Temp 98.6 F (37 C) (Axillary)   Resp 18   Ht 5' 11" (1.803 m)   Wt 58.6 kg   SpO2 96%   BMI 18.02 kg/m  Pain Scale: CPOT   Pain Score: 0-No pain   SpO2: SpO2: 96 % O2 Device:SpO2: 96 % O2 Flow Rate: .O2 Flow Rate (L/min): 15 L/min  IO: Intake/output summary:   Intake/Output Summary (Last 24 hours) at 04/19/2020 1416 Last data filed at 04/19/2020 0600 Gross per 24 hour  Intake 815.22 ml  Output 1500 ml  Net -684.78 ml    LBM: Last BM Date: 04/19/20 Baseline Weight: Weight: 59.9 kg Most recent weight: Weight: 58.6 kg       Spoke with CCM staff and MD. Messaged TOC.   Time In:1:50 Time Out: 2:20 Time Total: 30 min Greater than 50%  of this time was spent counseling and coordinating care related to the above assessment and plan.  Signed by: Asencion Gowda, NP   Please contact Palliative Medicine Team phone at [phone] for questions and concerns.  For individual provider: See Shea Evans

## 2020-04-19 NOTE — Progress Notes (Signed)
Pharmacy Antibiotic Note  Assessment:  Adam Arnold is a 75 y.o. male admitted on 03/28/2020 with HCAP. Pt WBC spiked on 3/26 and CXR done same day showed consolidation throughout lower right lobe, concerning for pneumonia and atelectasis. 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 Pharmacy has been consulted for Cefepime and Vancomycin dosing.   O2 requirement: on ventilator, FiO2: 85%   WBC       Date:  12.3         03/26 9.8           03/27 16.3         03/28  SCr:         Date:  1.09         03/25 0.95         03/26 0.79         03/27 0.87         03/28  UOP: 1500 mL 3/27   Vancomycin LD of Vanc 1250 mg x 1 per pt wt: 57.3 kg on 3/27 @ 0100 --- Dose of 1000mg  given 3/28 @ 0100  Plan: - Continue cefepime 2 grams Q8h  - Vancomycin 1000 mg Q24h > vancomycin 1250 mg Q24h  -----Goal AUC 400-550. -----Expected AUC: 525.1 -----SCr used: 0.87 -----TBW 58.6 < IBW 75.3  Pharmacy will continue to monitor Vanc levels (if needed) and SCr and will adjust abx doses if warranted.   Height: 5\' 11"  (180.3 cm) Weight: 58.6 kg (129 lb 3 oz) IBW/kg (Calculated) : 75.3  No data recorded.  Recent Labs  Lab 04/15/20 0406 04/15/20 0622 04/15/20 0951 04/16/20 0420 04/17/20 0440 04/18/20 0441 04/19/20 0411  WBC 11.0*  --   --  6.9 12.3* 9.8 16.3*  CREATININE 1.75*  --   --  1.09 0.95 0.79 0.87  LATICACIDVEN  --  1.8 0.9  --   --   --   --     Estimated Creatinine Clearance: 62.7 mL/min (by C-G formula based on SCr of 0.87 mg/dL).    No Known Allergies  Antimicrobials this admission: 03/26 Cefepime >>  03/26 Vancomycin >>    Dose adjustments this admission:  03/27 Cefepime 2 g Q12h > 2 g Q8h due to improved renal fxn  03/28 Vancomycin 1000 mg Q24h > 1250 mg Q24h due to improved renal fxn  Microbiology results: 3/27 BCx: Pending 3/23 MRSA PCR: Positive 3/27 trach aspirate: gram negative rods and gram positive  cocci in pairs, final pending  Thank you for allowing pharmacy to be a part of this patient's care.  4/23, Student-PharmD 04/19/2020

## 2020-04-19 NOTE — Progress Notes (Signed)
PHARMACY CONSULT NOTE - FOLLOW UP  Pharmacy Consult for Electrolyte Monitoring and Replacement   Recent Labs: Potassium (mmol/L)  Date Value  04/19/2020 4.7   Magnesium (mg/dL)  Date Value  84/13/2440 1.7   Calcium (mg/dL)  Date Value  11/19/2534 8.5 (L)   Albumin (g/dL)  Date Value  64/40/3474 2.8 (L)   Phosphorus (mg/dL)  Date Value  25/95/6387 2.7   Sodium (mmol/L)  Date Value  04/19/2020 137   Assessment: Pt is a 75 y/o M who presented with falls. Pt presented with weakness and dizziness. Pt had fallen multiple times. PMH includes CVA and alcohol abuse. Patient has acute renal failure secondary to ATN and severe hyponatremia likely due to beer potomania. Pharmacy has been consulted to assist with electrolyte management and replacement as indicated.    Creatinine: Scr 1.65>1.5>1.75>1.09>0.95> 0.87  Electrolyte levels: - K 4.7  Mag 1.7  Phos 2.7  Na 137    Nutrition: Tube feeds @ 50 mL/hr restarted 3/25. Prosource feeds daily restarted 3/26   Goal of Therapy:  Electrolytes within normal limits  Plan:  - No electrolyte replacement at this time - Monitor electrolytes with AM labs  - Nephrology following  Philis Fendt, Student-PharmD 04/19/2020

## 2020-04-20 DIAGNOSIS — J690 Pneumonitis due to inhalation of food and vomit: Secondary | ICD-10-CM

## 2020-04-20 LAB — GLUCOSE, CAPILLARY
Glucose-Capillary: 145 mg/dL — ABNORMAL HIGH (ref 70–99)
Glucose-Capillary: 171 mg/dL — ABNORMAL HIGH (ref 70–99)
Glucose-Capillary: 173 mg/dL — ABNORMAL HIGH (ref 70–99)
Glucose-Capillary: 174 mg/dL — ABNORMAL HIGH (ref 70–99)
Glucose-Capillary: 175 mg/dL — ABNORMAL HIGH (ref 70–99)
Glucose-Capillary: 202 mg/dL — ABNORMAL HIGH (ref 70–99)
Glucose-Capillary: 232 mg/dL — ABNORMAL HIGH (ref 70–99)

## 2020-04-20 LAB — BASIC METABOLIC PANEL
Anion gap: 8 (ref 5–15)
BUN: 64 mg/dL — ABNORMAL HIGH (ref 8–23)
CO2: 24 mmol/L (ref 22–32)
Calcium: 8.9 mg/dL (ref 8.9–10.3)
Chloride: 104 mmol/L (ref 98–111)
Creatinine, Ser: 1.69 mg/dL — ABNORMAL HIGH (ref 0.61–1.24)
GFR, Estimated: 42 mL/min — ABNORMAL LOW (ref >60)
Glucose, Bld: 196 mg/dL — ABNORMAL HIGH (ref 70–99)
Potassium: 4.7 mmol/L (ref 3.5–5.1)
Sodium: 136 mmol/L (ref 135–145)

## 2020-04-20 LAB — PHOSPHORUS: Phosphorus: 4.9 mg/dL — ABNORMAL HIGH (ref 2.5–4.6)

## 2020-04-20 LAB — CBC
HCT: 28.8 % — ABNORMAL LOW (ref 39.0–52.0)
Hemoglobin: 9.1 g/dL — ABNORMAL LOW (ref 13.0–17.0)
MCH: 32.9 pg (ref 26.0–34.0)
MCHC: 31.6 g/dL (ref 30.0–36.0)
MCV: 104 fL — ABNORMAL HIGH (ref 80.0–100.0)
Platelets: 215 10*3/uL (ref 150–400)
RBC: 2.77 MIL/uL — ABNORMAL LOW (ref 4.22–5.81)
RDW: 14.6 % (ref 11.5–15.5)
WBC: 17.4 10*3/uL — ABNORMAL HIGH (ref 4.0–10.5)
nRBC: 0 % (ref 0.0–0.2)

## 2020-04-20 LAB — CULTURE, RESPIRATORY W GRAM STAIN

## 2020-04-20 LAB — MAGNESIUM: Magnesium: 2.1 mg/dL (ref 1.7–2.4)

## 2020-04-20 LAB — TRIGLYCERIDES: Triglycerides: 529 mg/dL — ABNORMAL HIGH (ref <150)

## 2020-04-20 MED ORDER — QUETIAPINE FUMARATE 25 MG PO TABS
50.0000 mg | ORAL_TABLET | Freq: Every day | ORAL | Status: DC
  Administered 2020-04-20 – 2020-04-22 (×3): 50 mg
  Filled 2020-04-20 (×3): qty 2

## 2020-04-20 MED ORDER — VANCOMYCIN VARIABLE DOSE PER UNSTABLE RENAL FUNCTION (PHARMACIST DOSING): Status: DC

## 2020-04-20 MED ORDER — INSULIN ASPART 100 UNIT/ML ~~LOC~~ SOLN
0.0000 [IU] | SUBCUTANEOUS | Status: DC
  Administered 2020-04-20 (×3): 4 [IU] via SUBCUTANEOUS
  Administered 2020-04-20: 3 [IU] via SUBCUTANEOUS
  Administered 2020-04-20: 7 [IU] via SUBCUTANEOUS
  Administered 2020-04-21: 3 [IU] via SUBCUTANEOUS
  Administered 2020-04-21: 4 [IU] via SUBCUTANEOUS
  Administered 2020-04-21: 3 [IU] via SUBCUTANEOUS
  Administered 2020-04-21 (×2): 4 [IU] via SUBCUTANEOUS
  Administered 2020-04-21: 3 [IU] via SUBCUTANEOUS
  Administered 2020-04-22: 4 [IU] via SUBCUTANEOUS
  Administered 2020-04-22 – 2020-04-23 (×3): 3 [IU] via SUBCUTANEOUS
  Filled 2020-04-20 (×15): qty 1

## 2020-04-20 MED ORDER — SODIUM CHLORIDE 0.9 % IV SOLN
2.0000 g | Freq: Two times a day (BID) | INTRAVENOUS | Status: DC
  Filled 2020-04-20: qty 2

## 2020-04-20 MED ORDER — SODIUM CHLORIDE 0.9 % IV SOLN: INTRAVENOUS | Status: DC

## 2020-04-20 MED ORDER — SODIUM CHLORIDE 0.9 % IV BOLUS
500.0000 mL | Freq: Once | INTRAVENOUS | Status: AC
  Administered 2020-04-20: 500 mL via INTRAVENOUS

## 2020-04-20 MED ORDER — CEFAZOLIN SODIUM-DEXTROSE 2-4 GM/100ML-% IV SOLN
2.0000 g | Freq: Three times a day (TID) | INTRAVENOUS | Status: DC
  Administered 2020-04-20 – 2020-04-21 (×3): 2 g via INTRAVENOUS
  Filled 2020-04-20 (×5): qty 100

## 2020-04-20 NOTE — Plan of Care (Signed)
Pt UOP declined overnight, provider aware. 1L NS bolus given total per orders. Pt abnormal labs reviewed with provider. ST on monitor. No other significant events overnight.   Problem: Education: Goal: Knowledge of General Education information will improve Description: Including pain rating scale, medication(s)/side effects and non-pharmacologic comfort measures Outcome: Progressing   Problem: Health Behavior/Discharge Planning: Goal: Ability to manage health-related needs will improve Outcome: Progressing   Problem: Clinical Measurements: Goal: Ability to maintain clinical measurements within normal limits will improve Outcome: Progressing Goal: Will remain free from infection Outcome: Progressing Goal: Diagnostic test results will improve Outcome: Progressing Goal: Respiratory complications will improve Outcome: Progressing Goal: Cardiovascular complication will be avoided Outcome: Progressing   Problem: Coping: Goal: Level of anxiety will decrease Outcome: Progressing   Problem: Elimination: Goal: Will not experience complications related to bowel motility Outcome: Progressing Goal: Will not experience complications related to urinary retention Outcome: Progressing   Problem: Safety: Goal: Ability to remain free from injury will improve Outcome: Progressing   Problem: Pain Managment: Goal: General experience of comfort will improve Outcome: Progressing   Problem: Skin Integrity: Goal: Risk for impaired skin integrity will decrease Outcome: Progressing

## 2020-04-20 NOTE — Progress Notes (Signed)
PHARMACY CONSULT NOTE - FOLLOW UP  Pharmacy Consult for Electrolyte Monitoring and Replacement   Recent Labs: Potassium (mmol/L)  Date Value  04/20/2020 4.7   Magnesium (mg/dL)  Date Value  32/54/9826 2.1   Calcium (mg/dL)  Date Value  41/58/3094 8.9   Albumin (g/dL)  Date Value  07/68/0881 2.8 (L)   Phosphorus (mg/dL)  Date Value  11/23/5943 4.9 (H)   Sodium (mmol/L)  Date Value  04/20/2020 136   Assessment: Pt is a 75 y/o M who presented with falls. Pt presented with weakness and dizziness. Pt had fallen multiple times. PMH includes CVA and alcohol abuse. Patient has acute renal failure secondary to ATN and severe hyponatremia likely due to beer potomania. Pharmacy has been consulted to assist with electrolyte management and replacement as indicated.    Creatinine: Scr 1.65>1.5>1.75>1.09>0.95> 0.87 > 1.69  Electrolyte levels: - K 4.7  Mag 2.1  Phos 4.9  Na 136   Nutrition: Tube feeds @ 50 mL/hr restarted 3/25. Prosource feeds daily restarted 3/26   Goal of Therapy:  Electrolytes within normal limits  Plan:  - No electrolyte replacement at this time - Monitor electrolytes with AM labs  - Nephrology following  Philis Fendt, Student-PharmD 04/20/2020

## 2020-04-20 NOTE — Progress Notes (Signed)
[name], MRN:  [id number], DOB:  [birth year], LOS: 6 ADMISSION DATE:  04/20/2020, INITIAL CONSULTATION DATE: 03/30/2020 REFERRING MD: Dr. Charna Archer, CHIEF COMPLAINT: Falls  History of Present Illness:  75 yo male with a hx of current ETOH Abuse drinks unknown amount of alcohol daily.  He arrived to [facility] ER on 03/23 via EMS from a local motel where he has been residing over the past 2 weeks following multiple falls, and was  subsequently found crawling on the ground by the Field seismologist.  Upon EMS arrival pt noted to have multiple empty beer bottles around his room, although he reported he did not hav e a drink today.  Pt initially refused transport to the ER for treatment, therefore IVC paperwork completed due to concern of pts inability to care for himself although pt not suicidal or homicidal.    ED course Upon arrival to the ER pt reported he had a stroke 3 yrs ago resulting in frequent falls along with "some intermittent trouble" with his right side.  Lab results revealed Na+ 110, chloride 72, CO2 20, glucose 102, BUN 28, creatinine 1.65, anion gap 18, troponin 24, wbc 11.2, and alcohol level <10.  CXR negative for active cardiopulmonary disease, but concerning for artifact vs. nondisplaced fracture of the anterior left fifth rib.  Due to severe hyponatremia ER physician consulted on call Nephrologist Dr. Holley Raring who recommended placing pt on hypertonic 3% saline infusion.  PCCM contacted for ICU admission.    Pertinent  Medical History  Stroke Current ETOH Abuse  Tobacco Abuse   Significant Hospital Events: Including procedures, antibiotic start and stop dates in addition to other pertinent events   03/23: Pt admitted to ICU with severe hyponatremia requiring 3% hypertonic saline infusion  03/27: Remains intubated, mechanically ventilated 3/28 remains intubated, severe hypoxia 3/29 severe hypoxia severe ARDS  Interim History / Subjective:   Remains intubated Severe hypoxia Severe  ALI/ARDS +ETOH abuse Severe delirium and agitation   Objective   Blood pressure (!) 135/47, pulse (!) 107, temperature 98.5 F (36.9 C), temperature source Axillary, resp. rate 18, height 5' 10.98" (1.803 m), weight 63 kg, SpO2 99 %.    Vent Mode: PCV FiO2 (%):  [65 %-85 %] 65 % Set Rate:  [18 bmp] 18 bmp PEEP:  [10 cmH20] 10 cmH20 Plateau Pressure:  [21 cmH20] 21 cmH20   Intake/Output Summary (Last 24 hours) at 04/20/2020 0730 Last data filed at 04/20/2020 0630 Gross per 24 hour  Intake 2791.77 ml  Output 805 ml  Net 1986.77 ml   Filed Weights   04/18/20 0500 04/19/20 0500 04/20/20 0445  Weight: 58.6 kg 58.6 kg 63 kg   REVIEW OF SYSTEMS  PATIENT IS UNABLE TO PROVIDE COMPLETE REVIEW OF SYSTEM S DUE TO SEVERE CRITICAL ILLNESS AND ENCEPHALOPATHY   PHYSICAL EXAMINATION:  GENERAL:critically ill appearing, +resp distress HEAD: Normocephalic, atraumatic.  EYES: Pupils equal, round, reactive to light.  No scleral icterus.  MOUTH: Moist mucosal membrane. NECK: Supple. No thyromegaly. No nodules. No JVD.  PULMONARY: +rhonchi, +wheezing CARDIOVASCULAR: S1 and S2. Regular rate and rhythm. No murmurs, rubs, or gallops.  GASTROINTESTINAL: Soft, nontender, -distended. Positive bowel sounds.  MUSCULOSKELETAL: No swelling, clubbing, or edema.  NEUROLOGIC: obtunded SKIN:intact,warm,dry   Labs/imaging that I havepersonally reviewed   03/23: Na+ 110, Chloride 72, CO2 20, glucose 102, BUN 28, creatinine 1.65, anion gap 18, troponin 24, wbc 11.2, alcohol level <10, and EKG normal sinus rhythm no signs of ischemia  03/23: CXR negative for  active cardiopulmonary disease, but concerning for artifact vs. nondisplaced fracture of the anterior left fifth rib 03/23: CT Head Cervical Spine revealed no acute cervical spine fracture. Multilevel spondylosis and facet hypertrophy greatest from C4-5 through C6-7. 03/27: Reviewed all available data for today in addition reviewed chest imaging as  below:       Patient has severe consolidation of the right lower lobe.  Resolved Hospital Problem list   N/A  Assessment & Plan:   75 yo male with acute and severe ADS and severe hypoxic resp failure from acute RT LUNG PNEUMONIA complicated by DT's and severe ETOH abuse With severe hyponatremia and levated troponin likely secondary to demand ischemia Acute renal failure with anion gap metabolic acidosis secondary to ATN   Severe ACUTE Hypoxic and Hypercapnic Respiratory Failure -continue Mechanical Ventilator support -continue Bronchodilator Therapy -Wean Fio2 and PEEP as tolerated -VAP/VENT bundle implementation Vent Mode: PCV FiO2 (%):  [65 %-85 %] 65 % Set Rate:  [18 bmp] 18 bmp PEEP:  [10 cmH20] 10 cmH20 Plateau Pressure:  [21 cmH20] 21 cmH20 Severe hypoxia unable to wean   ACUTE KIDNEY INJURY/Renal Failure -continue Foley Catheter-assess need -Avoid nephrotoxic agents -Follow urine output, BMP -Ensure adequate renal perfusion, optimize oxygenation -Renal dose medications Severe hyponatremia likely secondary to beer potomania  Other possibilities include malignancy in particular small cell cancer   CARDIAC  Mildly elevated troponin likely secondary to demand ischemia in the setting of severe hyponatremia    INFECTIOUS DISEASE -continue antibiotics as prescribed -follow up cultures -follow up ID consultation  Anti-infectives (From admission, onward)   Start     Dose/Rate Route Frequency Ordered Stop   04/20/20 0100  vancomycin (VANCOREADY) IVPB 1250 mg/250 mL        1,250 mg 166.7 mL/hr over 90 Minutes Intravenous Every 24 hours 04/19/20 1348     04/19/20 0100  vancomycin (VANCOREADY) IVPB 1000 mg/200 mL  Status:  Discontinued        1,000 mg 200 mL/hr over 60 Minutes Intravenous Every 24 hours 04/18/20 0118 04/19/20 1348   04/18/20 1200  ceFEPIme (MAXIPIME) 2 g in sodium chloride 0.9 % 100 mL IVPB        2 g 200 mL/hr over 30 Minutes Intravenous Every 8  hours 04/18/20 1032     04/18/20 0015  vancomycin (VANCOREADY) IVPB 1250 mg/250 mL        1,250 mg 166.7 mL/hr over 90 Minutes Intravenous  Once 04/17/20 2319 04/18/20 0232   04/17/20 2345  ceFEPIme (MAXIPIME) 2 g in sodium chloride 0.9 % 100 mL IVPB  Status:  Discontinued        2 g 200 mL/hr over 30 Minutes Intravenous Every 12 hours 04/17/20 2259 04/18/20 1032       GI GI PROPHYLAXIS as indicated  NUTRITIONAL STATUS DIET-->TF's as tolerated Constipation protocol as indicated   NEUROLOGY Acute toxic metabolic encephalopathy, need for sedation Goal RASS -2 to -3 CT Head negative for acute intracranial process   Frequent falls, suspect cerebellar dysfunction due to alcohol abuse Current everyday ETOH abuse and tobacco abuse  high dose thiamine   Best practice (evaluated daily)  Diet:  Tube Feed  Pain/Anxiety/Delirium protocol (if indicated): Yes (RASS goal -2) VAP protocol (if indicated): Yes DVT prophylaxis: LMWH GI prophylaxis: N/A and PPI Glucose control:  SSI No Central venous access:  N/A Arterial line:  N/A Foley:  Yes, and it is still needed Mobility:  bed rest  PT consulted: N/A Last date of multidisciplinary  goals of care discussion: Due 3/28.  Have placed palliative care consultation.  Patient has very poor prognosis overall. Code Status:  full code Disposition: ICU    DVT/GI PRX ordered and assessed TRANSFUSIONS AS NEEDED MONITOR FSBS I Assessed the need for Labs I Assessed the need for Foley I Assessed the need for Central Venous Line Family Discussion when available I Assessed the need for Mobilization I made an Assessment of medications to be adjusted accordingly Safety Risk assessment completed  CASE DISCUSSED IN MULTIDISCIPLINARY ROUNDS WITH ICU TEAM         Labs   CBC: Recent Labs  Lab 04/17/2020 2039 04/04/2020 2222 04/16/20 0420 04/17/20 0440 04/18/20 0441 04/19/20 0411 04/20/20 0430  WBC 11.2*   < > 6.9 12.3* 9.8 16.3* 17.4*   NEUTROABS 9.4*  --   --   --   --   --   --   HGB 13.5   < > 10.1* 10.4* 10.5* 9.5* 9.1*  HCT RESULTS UNAVAILABLE DUE TO INTERFERING SUBSTANCE   < > 27.7* 30.0* 31.7* 28.4* 28.8*  MCV RESULTS UNAVAILABLE DUE TO INTERFERING SUBSTANCE   < > 90.5 94.6 98.8 99.3 104.0*  PLT 230   < > 152 187 196 192 215   < > = values in this interval not displayed.    Basic Metabolic Panel: Recent Labs  Lab 04/16/20 0420 04/16/20 0958 04/17/20 0440 04/17/20 0739 04/18/20 0441 04/18/20 1135 04/18/20 2019 04/19/20 0405 04/19/20 0411 04/19/20 2214 04/20/20 0430  NA 123*  123*   < > 130*   < > 133*   < > 136 135 137 138 136  K 4.1  --  4.4  --  4.8  --   --   --  4.7  --  4.7  CL 93*  --  97*  --  101  --   --   --  104  --  104  CO2 23  --  27  --  27  --   --   --  27  --  24  GLUCOSE 208*  --  151*  --  119*  --   --   --  201*  --  196*  BUN 25*  --  26*  --  32*  --   --   --  42*  --  64*  CREATININE 1.09  --  0.95  --  0.79  --   --   --  0.87  --  1.69*  CALCIUM 8.0*  --  8.4*  --  8.6*  --   --   --  8.5*  --  8.9  MG 1.8  --  1.8  --  1.9  --   --   --  1.7  --  2.1  PHOS 3.5  --  3.4  --  3.6  --   --   --  2.7  --  4.9*   < > = values in this interval not displayed.   GFR: Estimated Creatinine Clearance: 34.7 mL/min (A) (by C-G formula based on SCr of 1.69 mg/dL (H)). Recent Labs  Lab 04/15/20 0622 04/15/20 0951 04/16/20 0420 04/17/20 0440 04/17/20 1145 04/18/20 0441 04/19/20 0411 04/20/20 0430  PROCALCITON  --   --   --   --  0.33 0.53 0.50  --   WBC  --   --    < > 12.3*  --  9.8 16.3* 17.4*  LATICACIDVEN 1.8 0.9  --   --   --   --   --   --    < > =  values in this interval not displayed.    Liver Function Tests: Recent Labs  Lab 04/02/2020 2039 04/15/20 0406 04/16/20 0420 04/17/20 0440 04/18/20 0441  AST 75* 84*  --   --  24  ALT 30 29  --   --  16  ALKPHOS 99 100  --   --  48  BILITOT 1.3* 1.2  --   --  0.5  PROT 7.0 6.8  --   --  5.6*  ALBUMIN 3.9 3.9 3.5  3.3* 2.8*   No results for input(s): LIPASE, AMYLASE in the last 168 hours. No results for input(s): AMMONIA in the last 168 hours.  ABG    Component Value Date/Time   PHART 7.34 (L) 04/19/2020 0512   PCO2ART 49 (H) 04/19/2020 0512   PO2ART 89 04/19/2020 0512   HCO3 26.4 04/19/2020 0512   ACIDBASEDEF 0.4 04/18/2020 0500   O2SAT 96.3 04/19/2020 0512     Coagulation Profile: No results for input(s): INR, PROTIME in the last 168 hours.  Cardiac Enzymes: No results for input(s): CKTOTAL, CKMB, CKMBINDEX, TROPONINI in the last 168 hours.  HbA1C: Hgb A1c MFr Bld  Date/Time Value Ref Range Status  04/19/2020 04:11 AM 6.8 (H) 4.8 - 5.6 % Final    Comment:    (NOTE) Pre diabetes:          5.7%-6.4%  Diabetes:              >6.4%  Glycemic control for   <7.0% adults with diabetes     CBG: Recent Labs  Lab 04/19/20 1104 04/19/20 1624 04/19/20 1943 04/19/20 2359 04/20/20 0432  GLUCAP 157* 183* 216* 232* 175*    Allergies No Known Allergies   Medications: Scheduled Meds: . budesonide (PULMICORT) nebulizer solution  0.5 mg Nebulization BID  . chlorhexidine gluconate (MEDLINE KIT)  15 mL Mouth Rinse BID  . Chlorhexidine Gluconate Cloth  6 each Topical Daily  . docusate  100 mg Per Tube BID  . enoxaparin (LOVENOX) injection  40 mg Subcutaneous Q24H  . feeding supplement (PROSource TF)  45 mL Per Tube Daily  . folic acid  1 mg Per Tube Daily  . free water  30 mL Per Tube Q4H  . influenza vaccine adjuvanted  0.5 mL Intramuscular Tomorrow-1000  . insulin aspart  0-20 Units Subcutaneous Q4H  . ipratropium-albuterol  3 mL Nebulization Q6H  . mouth rinse  15 mL Mouth Rinse 10 times per day  . methylPREDNISolone (SOLU-MEDROL) injection  40 mg Intravenous Q12H  . multivitamin with minerals  1 tablet Per Tube Daily  . mupirocin ointment  1 application Nasal BID  . pantoprazole sodium  40 mg Per Tube Daily  . polyethylene glycol  17 g Per Tube Daily  . thiamine injection   100 mg Intravenous Q24H   Continuous Infusions: . sodium chloride    . sodium chloride Stopped (04/20/20 0431)  . ceFEPime (MAXIPIME) IV 2 g (04/20/20 0435)  . feeding supplement (VITAL AF 1.2 CAL) 1,000 mL (04/18/20 2147)  . HYDROmorphone 1 mg/hr (04/20/20 0435)  . midazolam 2 mg/hr (04/20/20 0435)  . propofol (DIPRIVAN) infusion 20 mcg/kg/min (04/20/20 0641)  . vancomycin Stopped (04/20/20 0204)   PRN Meds:.acetaminophen, docusate sodium, midazolam, ondansetron (ZOFRAN) IV, oxyCODONE-acetaminophen, polyethylene glycol, sodium chloride flush    Critical Care Time devoted to patient care services described in this note is 55 minutes.   Overall, patient is critically ill, prognosis is guarded.  Patient with Multiorgan failure and at high risk  for cardiac arrest and death.    Corrin Parker, M.D.  [facility]  Medical Director [facility] Director [facility] Cardio-Pulmonary Department

## 2020-04-20 NOTE — Progress Notes (Signed)
[facility] Daily Progress Note   Subjective: Initial consult: 04/18/20 for gangrene toes  Patient remains critically ill sedated and vented.  Objective: Vitals:   04/20/20 0445 04/20/20 0500 04/20/20 0600 04/20/20 0800  BP:  (!) 133/48 (!) 135/47 (!) 154/57  Pulse:  (!) 109 (!) 107 (!) 123  Resp:  18 18 18   Temp:      TempSrc:      SpO2:  99% 99% 99%  Weight: 63 kg     Height:        Intake/Output Summary (Last 24 hours) at 04/20/2020 1137 Last data filed at 04/20/2020 0800 Gross per 24 hour  Intake 3417.31 ml  Output 805 ml  Net 2612.31 ml   Physical Exam: A&Ox3, NAD CV: RRR Pulmonary: CTA Bilaterally Abdomen: Soft, Nontender, Nondistended Vascular:  Right lower extremity: Thigh soft.  Calf soft.  Extremities warm Transitions: Right below the knee.  Below the knee is mottled there is gangrenous changes noted to the first and second toes.  Nonpalpable pedal pulses.   Laboratory: CBC    Component Value Date/Time   WBC 17.4 (H) 04/20/2020 0430   HGB 9.1 (L) 04/20/2020 0430   HCT 28.8 (L) 04/20/2020 0430   PLT 215 04/20/2020 0430   BMET    Component Value Date/Time   NA 136 04/20/2020 0430   K 4.7 04/20/2020 0430   CL 104 04/20/2020 0430   CO2 24 04/20/2020 0430   GLUCOSE 196 (H) 04/20/2020 0430   BUN 64 (H) 04/20/2020 0430   CREATININE 1.69 (H) 04/20/2020 0430   CALCIUM 8.9 04/20/2020 0430   GFRNONAA 42 (L) 04/20/2020 0430   Assessment/Planning: The patient is a 75 year old male multiple medication known to nearby vascular surgery was initially consulted from April 18, 2020 for right toe gangrene  1) mottled appearance below the knee, gangrenous changes to the toes noted on physical exam.  Patient is currently critically ill, vented unable to provide any additional history including appearance of symptoms, etc.  2) at this time, mottling noted on physical exam (RLE distally).  Unsure if the extremity will remain salvageable at this  time.   3)Will plan on an angiogram with the patient is more stable. Most likely BKA versus AKA. Unfortunately, above-the-knee amputation seems realistic.  Discussed with Dr. April 20, 2020 Eufelia Veno PA-C 04/20/2020 11:37 AM

## 2020-04-21 ENCOUNTER — Inpatient Hospital Stay (HOSPITAL_COMMUNITY)
Admit: 2020-04-21 | Discharge: 2020-04-21 | Disposition: A | Discharge status: Home / Self Care | Payer: No Typology Code available for payment source | Attending: Pulmonary Disease | Admitting: Pulmonary Disease

## 2020-04-21 ENCOUNTER — Encounter
Admission: EM | Disposition: E | Payer: No Typology Code available for payment source | Source: Home / Self Care | Attending: Internal Medicine

## 2020-04-21 DIAGNOSIS — I428 Other cardiomyopathies: Secondary | ICD-10-CM | POA: Diagnosis not present

## 2020-04-21 DIAGNOSIS — J155 Pneumonia due to Escherichia coli: Secondary | ICD-10-CM

## 2020-04-21 DIAGNOSIS — R198 Other specified symptoms and signs involving the digestive system and abdomen: Secondary | ICD-10-CM

## 2020-04-21 DIAGNOSIS — J9601 Acute respiratory failure with hypoxia: Secondary | ICD-10-CM

## 2020-04-21 DIAGNOSIS — J9602 Acute respiratory failure with hypercapnia: Secondary | ICD-10-CM

## 2020-04-21 LAB — CBC
HCT: 29.1 % — ABNORMAL LOW (ref 39.0–52.0)
Hemoglobin: 9.1 g/dL — ABNORMAL LOW (ref 13.0–17.0)
MCH: 32.6 pg (ref 26.0–34.0)
MCHC: 31.3 g/dL (ref 30.0–36.0)
MCV: 104.3 fL — ABNORMAL HIGH (ref 80.0–100.0)
Platelets: 212 10*3/uL (ref 150–400)
RBC: 2.79 MIL/uL — ABNORMAL LOW (ref 4.22–5.81)
RDW: 15.2 % (ref 11.5–15.5)
WBC: 14.9 10*3/uL — ABNORMAL HIGH (ref 4.0–10.5)
nRBC: 0 % (ref 0.0–0.2)

## 2020-04-21 LAB — RENAL FUNCTION PANEL
Albumin: 2.3 g/dL — ABNORMAL LOW (ref 3.5–5.0)
Anion gap: 11 (ref 5–15)
BUN: 96 mg/dL — ABNORMAL HIGH (ref 8–23)
CO2: 21 mmol/L — ABNORMAL LOW (ref 22–32)
Calcium: 8.3 mg/dL — ABNORMAL LOW (ref 8.9–10.3)
Chloride: 106 mmol/L (ref 98–111)
Creatinine, Ser: 2.87 mg/dL — ABNORMAL HIGH (ref 0.61–1.24)
GFR, Estimated: 22 mL/min — ABNORMAL LOW (ref >60)
Glucose, Bld: 162 mg/dL — ABNORMAL HIGH (ref 70–99)
Phosphorus: 5.4 mg/dL — ABNORMAL HIGH (ref 2.5–4.6)
Potassium: 5.4 mmol/L — ABNORMAL HIGH (ref 3.5–5.1)
Sodium: 138 mmol/L (ref 135–145)

## 2020-04-21 LAB — TRIGLYCERIDES: Triglycerides: 85 mg/dL (ref <150)

## 2020-04-21 LAB — POTASSIUM: Potassium: 5.2 mmol/L — ABNORMAL HIGH (ref 3.5–5.1)

## 2020-04-21 LAB — GLUCOSE, CAPILLARY
Glucose-Capillary: 167 mg/dL — ABNORMAL HIGH (ref 70–99)
Glucose-Capillary: 168 mg/dL — ABNORMAL HIGH (ref 70–99)
Glucose-Capillary: 140 mg/dL — ABNORMAL HIGH (ref 70–99)
Glucose-Capillary: 137 mg/dL — ABNORMAL HIGH (ref 70–99)
Glucose-Capillary: 139 mg/dL — ABNORMAL HIGH (ref 70–99)
Glucose-Capillary: 107 mg/dL — ABNORMAL HIGH (ref 70–99)

## 2020-04-21 LAB — ECHOCARDIOGRAM COMPLETE
AV Mean grad: 6 mmHg
AV Peak grad: 12.8 mmHg
Ao pk vel: 1.79 m/s
Area-P 1/2: 5.54 cm2
Height: 70.984 in
Weight: 2222.24 oz

## 2020-04-21 LAB — MAGNESIUM: Magnesium: 2.3 mg/dL (ref 1.7–2.4)

## 2020-04-21 SURGERY — LOWER EXTREMITY ANGIOGRAPHY
Anesthesia: Moderate Sedation | Laterality: Right

## 2020-04-21 MED ORDER — ENOXAPARIN SODIUM 30 MG/0.3ML ~~LOC~~ SOLN
30.0000 mg | SUBCUTANEOUS | Status: DC
  Administered 2020-04-21 – 2020-04-22 (×2): 30 mg via SUBCUTANEOUS
  Filled 2020-04-21 (×2): qty 0.3

## 2020-04-21 MED ORDER — SODIUM ZIRCONIUM CYCLOSILICATE 5 G PO PACK
10.0000 g | PACK | Freq: Once | ORAL | Status: AC
  Administered 2020-04-21: 10 g
  Filled 2020-04-21: qty 2

## 2020-04-21 MED ORDER — CEFAZOLIN SODIUM-DEXTROSE 2-4 GM/100ML-% IV SOLN
2.0000 g | Freq: Two times a day (BID) | INTRAVENOUS | Status: DC
  Administered 2020-04-21 – 2020-04-23 (×4): 2 g via INTRAVENOUS
  Filled 2020-04-21 (×5): qty 100

## 2020-04-21 MED ORDER — METHYLPREDNISOLONE SODIUM SUCC 40 MG IJ SOLR
20.0000 mg | Freq: Every day | INTRAMUSCULAR | Status: DC
  Administered 2020-04-22 – 2020-04-23 (×2): 20 mg via INTRAVENOUS
  Filled 2020-04-21 (×2): qty 1

## 2020-04-21 MED ORDER — BISACODYL 10 MG RE SUPP
10.0000 mg | Freq: Once | RECTAL | Status: AC
  Administered 2020-04-21: 10 mg via RECTAL
  Filled 2020-04-21: qty 1

## 2020-04-21 NOTE — Progress Notes (Signed)
GOALS OF CARE DISCUSSION  The Clinical status was relayed to family in detail. Christen Bame the Brother [phone]  Updated and notified of patients medical condition.  Patient remains unresponsive and will not open eyes to command.    Patient is having a weak cough and struggling to remove secretions.   Patient with increased WOB and using accessory muscles to breathe Explained to family course of therapy and the modalities     Patient with Progressive multiorgan failure with a very high probablity of a very minimal chance of meaningful recovery despite all aggressive and optimal medical therapy. Patient is in the Dying  Process associated with Suffering-patient with progressive multiorgan failure    Family understands the situation.  They have consented and agreed to DNR/DNI.  Family are satisfied with Plan of action and management. All questions answered  Additional CC time 35 mins   Karima Carrell Santiago Glad, M.D.  [facility]  Medical Director [facility] Medical Director [facility] Cardio-Pulmonary Department

## 2020-04-21 NOTE — Progress Notes (Addendum)
Nutrition Follow Up Note   DOCUMENTATION CODES:   Severe malnutrition in context of social or environmental circumstances  INTERVENTION:   Continue Vital 1.2 @50ml /hr + Pro-Source 30m daily via tube  Propofol: 7.03 ml/hr- provides 186kcal/day   Free water flushes 322mq4 hours to maintain tube patency   Regimen provides 1480kcal/day, 101g/day protein and 115372may (with propofol provides 1666kcal/day)  NUTRITION DIAGNOSIS:   Severe Malnutrition related to social / environmental circumstances (homeless, etoh abuse) as evidenced by severe fat depletion,severe muscle depletion.  GOAL:   Provide needs based on ASPEN/SCCM guidelines -met with tube feeds   MONITOR:   Labs,Weight trends,Skin,I & O's,Vent status,TF tolerance  ASSESSMENT:   [age]o. male with a PMHx of prior history of CVA, alcohol abuse, tobacco abuse and homelessness who was [facility] for AMS. Pt with E coli aspiration pneumonia and gangrene of lower extremity   Pt sedated and ventilated. OGT in place; pt tolerating tube feeds at goal rate. Per chart, pt up ~12lbs since admit. Pt with worsening renal values today.   Medications reviewed and include: colace, lovenox, folic acid, insulin, solu-medrol, MVI, protonix, miralax, thiamine, NaCl @50ml /hr, cefazolin, hydromorphone, propofol  Labs reviewed: K 5.4(H), BUN 96(H), creat 2.87(H), P 5.4(H), Mg 2.3 wnl Wbc 14.9(H), Hgb 9.1(L), Hct 29.1(L) cbgs- 167, 168, 140 x 24 hrs  Patient is currently intubated on ventilator support MV: 7.3 L/min Temp (24hrs), Avg:98.4 F (36.9 C), Min:98.3 F (36.8 C), Max:98.5 F (36.9 C)  Propofol: 7.03 ml/hr- provides 186kcal/day   UOP- [id number] Order:   Diet Order    None     EDUCATION NEEDS:   No education needs have been identified at this time  Skin:  Skin Assessment: Reviewed RN Assessment (ecchymosis)  Last BM:  3/28  Height:   Ht Readings from Last 1 Encounters:  04/07/2020 5' 10.98"  (1.803 m)    Weight:   Wt Readings from Last 1 Encounters:  04/20/20 63 kg    Ideal Body Weight:  78.18 kg  BMI:  Body mass index is 19.38 kg/m.  Estimated Nutritional Needs:   Kcal:  1440kcal/day  Protein:  90-105g/day  Fluid:  1.5.1.7L/day  CaseKoleen Distance RD, LDN Please refer to [facility] and/or RD on-call/weekend/after hours pager

## 2020-04-21 NOTE — Progress Notes (Signed)
PHARMACY CONSULT NOTE - FOLLOW UP  Pharmacy Consult for Electrolyte Monitoring and Replacement   Recent Labs: Potassium (mmol/L)  Date Value  04/11/2020 5.2 (H)   Magnesium (mg/dL)  Date Value  08/65/7846 2.3   Calcium (mg/dL)  Date Value  96/29/5284 8.3 (L)   Albumin (g/dL)  Date Value  13/24/4010 2.3 (L)   Phosphorus (mg/dL)  Date Value  27/25/3664 5.4 (H)   Sodium (mmol/L)  Date Value  04/05/2020 138   Assessment: Pt is a 75 y/o M who presented with falls. Pt presented with weakness and dizziness. Pt had fallen multiple times. PMH includes CVA and alcohol abuse. Patient has acute renal failure secondary to ATN and severe hyponatremia likely due to beer potomania. Pharmacy has been consulted to assist with electrolyte management and replacement as indicated.    Creatinine: SCr: 0.87 > 1.69 > 2.87  UOP trending down 1500 > 805 > 505  MIVF: free water 30 mL Q4h  (180 mL daily)   Electrolyte levels: - K 5.4 > 5.2   Mag 2.3  Phos 5.4  Na 138   Nutrition: Tube feeds @ 50 mL/hr restarted 3/25. Prosource feeds daily restarted 3/26    Goal of Therapy:  Electrolytes within normal limits  Plan:  - K: repeat Lokelma 10 g x 1 dose  - Recheck electrolytes with AM labs   - Nephrology following  Sharen Hones, PharmD, BCPS Clinical Pharmacist  04/19/2020

## 2020-04-21 NOTE — Progress Notes (Addendum)
PHARMACY CONSULT NOTE - FOLLOW UP  Pharmacy Consult for Electrolyte Monitoring and Replacement   Recent Labs: Potassium (mmol/L)  Date Value  04/14/2020 5.4 (H)   Magnesium (mg/dL)  Date Value  22/48/2500 2.3   Calcium (mg/dL)  Date Value  37/04/8887 8.3 (L)   Albumin (g/dL)  Date Value  16/94/5038 2.3 (L)   Phosphorus (mg/dL)  Date Value  88/28/0034 5.4 (H)   Sodium (mmol/L)  Date Value  04/18/2020 138   Assessment: Pt is a 75 y/o M who presented with falls. Pt presented with weakness and dizziness. Pt had fallen multiple times. PMH includes CVA and alcohol abuse. Patient has acute renal failure secondary to ATN and severe hyponatremia likely due to beer potomania. Pharmacy has been consulted to assist with electrolyte management and replacement as indicated.    Creatinine: SCr: 0.87 > 1.69 > 2.87  UOP trending down 1500 > 805 > 505  MIVF: free water 30 mL Q4h  (180 mL daily)   Electrolyte levels: - K 5.4  Mag 2.3  Phos 5.4  Na 138   Nutrition: Tube feeds @ 50 mL/hr restarted 3/25. Prosource feeds daily restarted 3/26    Goal of Therapy:  Electrolytes within normal limits  Plan:  - K: give Lokelma 10 g x 1 dose  - Recheck K at 1800  - Monitor all other electrolytes with AM labs   - Nephrology following  Philis Fendt, Student-PharmD 04/08/2020

## 2020-04-21 NOTE — TOC Progression Note (Addendum)
Transition of Care [facility]) - Progression Note    Patient Details  NameAshten Arnold MRN: [id number] Date of Birth: 02/23/1945  Transition of Care [facility]) CM/SW Contact  Adam Arnold, [place] Phone Number: 03/30/2020, 1:23 PM  Clinical Narrative:     CSW spoke with patient's brother, Adam Arnold [phone].  Mr. Nurse stated he has not spoken to his brother in 10 years and does not want to take him in or help him financially.  CSW updated Adam Arnold that his brother is in the ICU , and currently intubated and sedated and unable to make decisions for himself.  CSW stated that we need to have a next of kin make medical decisions for the patient until he can make them.  Adam Arnold verbalized understanding and stated he would be willing to speak with the Attending. CSW updated Attending on finding Mr. Powley and requested Attending call him.  Attending confirmed receipt of request.  Update:  Attending spoke with patient's brother Adam Arnold.  Patient is now DNR/DNI, comfort measures planned for the next 24-48 hours, w/ end of life visitation.   Expected Discharge Plan: Long Term Nursing Home Barriers to Discharge: Financial Resources,Transportation,Inadequate or no insurance,Unsafe home situation,Continued Medical Work up,Homeless with medical needs  Expected Discharge Plan and Services Expected Discharge Plan: Long Term Nursing Home In-house Referral: Clinical Social Work     Living arrangements for the past 2 months: Homeless                                       Social Determinants of Health (SDOH) Interventions    Readmission Risk Interventions No flowsheet data found.

## 2020-04-21 NOTE — Plan of Care (Signed)
PT without any significant events on shift. See epic for charting. Relinquishing care, handoff given.  Problem: Education: Goal: Knowledge of General Education information will improve Description: Including pain rating scale, medication(s)/side effects and non-pharmacologic comfort measures Outcome: Progressing   Problem: Health Behavior/Discharge Planning: Goal: Ability to manage health-related needs will improve Outcome: Progressing   Problem: Clinical Measurements: Goal: Ability to maintain clinical measurements within normal limits will improve Outcome: Progressing Goal: Will remain free from infection Outcome: Progressing Goal: Diagnostic test results will improve Outcome: Progressing Goal: Respiratory complications will improve Outcome: Progressing Goal: Cardiovascular complication will be avoided Outcome: Progressing   Problem: Activity: Goal: Risk for activity intolerance will decrease Outcome: Progressing   Problem: Nutrition: Goal: Adequate nutrition will be maintained Outcome: Progressing

## 2020-04-21 NOTE — Progress Notes (Signed)
PHARMACIST - PHYSICIAN COMMUNICATION  CONCERNING:  Enoxaparin (Lovenox) for DVT Prophylaxis    RECOMMENDATION: Patient was prescribed enoxaparin 40mg  q24 hours for VTE prophylaxis.   Filed Weights   04/18/20 0500 04/19/20 0500 04/20/20 0445  Weight: 58.6 kg (129 lb 3 oz) 58.6 kg (129 lb 3 oz) 63 kg (138 lb 14.2 oz)    Body mass index is 19.38 kg/m.  Estimated Creatinine Clearance: 20.4 mL/min (A) (by C-G formula based on SCr of 2.87 mg/dL (H)).   Patient is candidate for enoxaparin 30mg  every 24 hours based on CrCl <2ml/min or Weight <45kg  DESCRIPTION: Pharmacy has adjusted enoxaparin dose per [facility] policy.  Patient is now receiving enoxaparin 30 mg every 24 hours   31m 04/22/2020 6:18 PM

## 2020-04-21 NOTE — Progress Notes (Addendum)
NAMEArlow Arnold, MRN:  [id number], DOB:  06-16-1945, LOS: 7 ADMISSION DATE:  04/10/2020, INITIAL CONSULTATION DATE: 04/04/2020 REFERRING MD: Dr. Charna Archer, CHIEF COMPLAINT: Falls  History of Present Illness:  75 yo male with a hx of current ETOH Abuse drinks unknown amount of alcohol daily.  He arrived to [facility] ER on 03/23 via EMS from a local motel where he has been residing over the past 2 weeks following multiple falls, and was  subsequently found crawling on the ground by the Field seismologist.  Upon EMS arrival pt noted to have multiple empty beer bottles around his room, although he reported he did not hav e a drink today.  Pt initially refused transport to the ER for treatment, therefore IVC paperwork completed due to concern of pts inability to care for himself although pt not suicidal or homicidal.    ED course Upon arrival to the ER pt reported he had a stroke 3 yrs ago resulting in frequent falls along with "some intermittent trouble" with his right side.  Lab results revealed Na+ 110, chloride 72, CO2 20, glucose 102, BUN 28, creatinine 1.65, anion gap 18, troponin 24, wbc 11.2, and alcohol level <10.  CXR negative for active cardiopulmonary disease, but concerning for artifact vs. nondisplaced fracture of the anterior left fifth rib.  Due to severe hyponatremia ER physician consulted on call Nephrologist Dr. Holley Raring who recommended placing pt on hypertonic 3% saline infusion.  PCCM contacted for ICU admission.    Pertinent  Medical History  Stroke Current ETOH Abuse  Tobacco Abuse   Significant Hospital Events: Including procedures, antibiotic start and stop dates in addition to other pertinent events   03/23: Pt admitted to ICU with severe hyponatremia requiring 3% hypertonic saline infusion  03/27: Remains intubated, mechanically ventilated 3/28 remains intubated, severe hypoxia 3/29 severe hypoxia severe ARDS +e coli pneumonia 3/30 severe hypoixa  Interim History / Subjective:   Remains critically ill Severe hypoxia Severe ALI +etoh abuse Severe DT's E coli pneumonia   Objective   Blood pressure (!) 137/51, pulse 82, temperature 98.3 F (36.8 C), temperature source Oral, resp. rate 18, height 5' 10.98" (1.803 m), weight 63 kg, SpO2 100 %.    Vent Mode: PRVC FiO2 (%):  [40 %-65 %] 40 % Set Rate:  [18 bmp] 18 bmp Vt Set:  [400 mL-450 mL] 450 mL PEEP:  [10 cmH20] 10 cmH20   Intake/Output Summary (Last 24 hours) at 04/07/2020 0719 Last data filed at 03/28/2020 0532 Gross per 24 hour  Intake 2332.71 ml  Output 505 ml  Net 1827.71 ml   Filed Weights   04/18/20 0500 04/19/20 0500 04/20/20 0445  Weight: 58.6 kg 58.6 kg 63 kg   REVIEW OF SYSTEMS  PATIENT IS UNABLE TO PROVIDE COMPLETE REVIEW OF SYSTEMS DUE TO SEVERE CRITICAL ILLNESS AND TOXIC METABOLIC ENCEPHALOPATHY  PHYSICAL EXAMINATION:  GENERAL:critically ill appearing, +resp distress HEAD: Normocephalic, atraumatic.  EYES: Pupils equal, round, reactive to light.  No scleral icterus.  MOUTH: Moist mucosal membrane. NECK: Supple. No thyromegaly. No nodules. No JVD.  PULMONARY: +rhonchi, +wheezing CARDIOVASCULAR: S1 and S2. Regular rate and rhythm. No murmurs, rubs, or gallops.  GASTROINTESTINAL: Soft, nontender, -distended. Positive bowel sounds.  MUSCULOSKELETAL: No swelling, clubbing, or edema.  NEUROLOGIC: obtunded SKIN:intact,warm,dry     Labs/imaging that I havepersonally reviewed   03/23: Na+ 110, Chloride 72, CO2 20, glucose 102, BUN 28, creatinine 1.65, anion gap 18, troponin 24, wbc 11.2, alcohol level <10, and EKG normal sinus rhythm  no signs of ischemia  03/23: CXR negative for active cardiopulmonary disease, but concerning for artifact vs. nondisplaced fracture of the anterior left fifth rib 03/23: CT Head Cervical Spine revealed no acute cervical spine fracture. Multilevel spondylosis and facet hypertrophy greatest from C4-5 through C6-7. 03/27: Reviewed all available data for  today in addition reviewed chest imaging as below:       Patient has severe consolidation of the right lower lobe.  Resolved Hospital Problem list   N/A  Assessment & Plan:   75 yo male with acute and severe ADS and severe hypoxic resp failure from acute RT LUNG PNEUMONIA complicated by DT's and severe ETOH abuse With severe hyponatremia and levated troponin likely secondary to demand ischemia Acute renal failure with anion gap metabolic acidosis secondary to ATN with acute aspiration pneumonia with E coli pneumonia and gangrene of lower extremity   Severe ACUTE Hypoxic and Hypercapnic Respiratory Failure -continue Mechanical Ventilator support -continue Bronchodilator Therapy -Wean Fio2 and PEEP as tolerated -VAP/VENT bundle implementation -will perform SAT/SBT when respiratory parameters are met Vent Mode: PRVC FiO2 (%):  [40 %-65 %] 40 % Set Rate:  [18 bmp] 18 bmp Vt Set:  [400 mL-450 mL] 450 mL PEEP:  [10 cmH20] 10 cmH20   ACUTE KIDNEY INJURY/Renal Failure -continue Foley Catheter-assess need -Avoid nephrotoxic agents -Follow urine output, BMP -Ensure adequate renal perfusion, optimize oxygenation -Renal dose medications Severe hyponatremia likely secondary to beer potomania  Other possibilities include malignancy in particular small cell cancer  SEVERE ALCOHOL WITHDRAWAL -Therapy with Thiamine and MVI -CIWA Protocol Vent support    CARDIAC  Mildly elevated troponin likely secondary to demand ischemia in the setting of severe hyponatremia    INFECTIOUS DISEASE E coli Pneumonia -continue antibiotics as prescribed -follow up cultures  Anti-infectives (From admission, onward)   Start     Dose/Rate Route Frequency Ordered Stop   04/20/20 1800  ceFEPIme (MAXIPIME) 2 g in sodium chloride 0.9 % 100 mL IVPB  Status:  Discontinued        2 g 200 mL/hr over 30 Minutes Intravenous Every 12 hours 04/20/20 0742 04/20/20 1159   04/20/20 1400  ceFAZolin (ANCEF) IVPB  2g/100 mL premix        2 g 200 mL/hr over 30 Minutes Intravenous Every 8 hours 04/20/20 1200     04/20/20 0743  vancomycin variable dose per unstable renal function (pharmacist dosing)  Status:  Discontinued         Does not apply See admin instructions 04/20/20 0743 04/20/20 1159   04/20/20 0100  vancomycin (VANCOREADY) IVPB 1250 mg/250 mL  Status:  Discontinued        1,250 mg 166.7 mL/hr over 90 Minutes Intravenous Every 24 hours 04/19/20 1348 04/20/20 0742   04/19/20 0100  vancomycin (VANCOREADY) IVPB 1000 mg/200 mL  Status:  Discontinued        1,000 mg 200 mL/hr over 60 Minutes Intravenous Every 24 hours 04/18/20 0118 04/19/20 1348   04/18/20 1200  ceFEPIme (MAXIPIME) 2 g in sodium chloride 0.9 % 100 mL IVPB  Status:  Discontinued        2 g 200 mL/hr over 30 Minutes Intravenous Every 8 hours 04/18/20 1032 04/20/20 0742   04/18/20 0015  vancomycin (VANCOREADY) IVPB 1250 mg/250 mL        1,250 mg 166.7 mL/hr over 90 Minutes Intravenous  Once 04/17/20 2319 04/18/20 0232   04/17/20 2345  ceFEPIme (MAXIPIME) 2 g in sodium chloride 0.9 % 100 mL  IVPB  Status:  Discontinued        2 g 200 mL/hr over 30 Minutes Intravenous Every 12 hours 04/17/20 2259 04/18/20 1032     GANGRENE FEET Vasc surgery following   GI GI PROPHYLAXIS as indicated  NUTRITIONAL STATUS DIET-->TF's as tolerated Constipation protocol as indicated ENDO - ICU hypoglycemic\Hyperglycemia protocol -check FSBS per protocol  ELECTROLYTES -follow labs as needed -replace as needed -pharmacy consultation and following   NEUROLOGY ACUTE TOXIC METABOLIC ENCEPHALOPATHY -need for sedation -Goal RASS -2 to -3 CT Head negative for acute intracranial process   Frequent falls, suspect cerebellar dysfunction due to alcohol abuse Current everyday ETOH abuse and tobacco abuse  high dose thiamine   Best practice (evaluated daily)  Diet:  Tube Feed  Pain/Anxiety/Delirium protocol (if indicated): Yes (RASS goal  -2) VAP protocol (if indicated): Yes DVT prophylaxis: LMWH GI prophylaxis: N/A and PPI Glucose control:  SSI No Central venous access:  N/A Arterial line:  N/A Foley:  Yes, and it is still needed Mobility:  bed rest  PT consulted: N/A Last date of multidisciplinary goals of care discussion: Due 3/28.  Have placed palliative care consultation.  Patient has very poor prognosis overall. Code Status:  full code Disposition: ICU      Labs   CBC: Recent Labs  Lab 04/12/2020 2039 04/03/2020 2222 04/17/20 0440 04/18/20 0441 04/19/20 0411 04/20/20 0430 03/30/2020 0625  WBC 11.2*   < > 12.3* 9.8 16.3* 17.4* 14.9*  NEUTROABS 9.4*  --   --   --   --   --   --   HGB 13.5   < > 10.4* 10.5* 9.5* 9.1* 9.1*  HCT RESULTS UNAVAILABLE DUE TO INTERFERING SUBSTANCE   < > 30.0* 31.7* 28.4* 28.8* 29.1*  MCV RESULTS UNAVAILABLE DUE TO INTERFERING SUBSTANCE   < > 94.6 98.8 99.3 104.0* 104.3*  PLT 230   < > 187 196 192 215 212   < > = values in this interval not displayed.    Basic Metabolic Panel: Recent Labs  Lab 04/16/20 0420 04/16/20 0958 04/17/20 0440 04/17/20 0739 04/18/20 0441 04/18/20 1135 04/18/20 2019 04/19/20 0405 04/19/20 0411 04/19/20 2214 04/20/20 0430  NA 123*  123*   < > 130*   < > 133*   < > 136 135 137 138 136  K 4.1  --  4.4  --  4.8  --   --   --  4.7  --  4.7  CL 93*  --  97*  --  101  --   --   --  104  --  104  CO2 23  --  27  --  27  --   --   --  27  --  24  GLUCOSE 208*  --  151*  --  119*  --   --   --  201*  --  196*  BUN 25*  --  26*  --  32*  --   --   --  42*  --  64*  CREATININE 1.09  --  0.95  --  0.79  --   --   --  0.87  --  1.69*  CALCIUM 8.0*  --  8.4*  --  8.6*  --   --   --  8.5*  --  8.9  MG 1.8  --  1.8  --  1.9  --   --   --  1.7  --  2.1  PHOS  3.5  --  3.4  --  3.6  --   --   --  2.7  --  4.9*   < > = values in this interval not displayed.   GFR: Estimated Creatinine Clearance: 34.7 mL/min (A) (by C-G formula based on SCr of 1.69 mg/dL  (H)). Recent Labs  Lab 04/15/20 0622 04/15/20 0951 04/16/20 0420 04/17/20 1145 04/18/20 0441 04/19/20 0411 04/20/20 0430 04/04/2020 0625  PROCALCITON  --   --   --  0.33 0.53 0.50  --   --   WBC  --   --    < >  --  9.8 16.3* 17.4* 14.9*  LATICACIDVEN 1.8 0.9  --   --   --   --   --   --    < > = values in this interval not displayed.    Liver Function Tests: Recent Labs  Lab 04/13/2020 2039 04/15/20 0406 04/16/20 0420 04/17/20 0440 04/18/20 0441  AST 75* 84*  --   --  24  ALT 30 29  --   --  16  ALKPHOS 99 100  --   --  48  BILITOT 1.3* 1.2  --   --  0.5  PROT 7.0 6.8  --   --  5.6*  ALBUMIN 3.9 3.9 3.5 3.3* 2.8*   No results for input(s): LIPASE, AMYLASE in the last 168 hours. No results for input(s): AMMONIA in the last 168 hours.  ABG    Component Value Date/Time   PHART 7.34 (L) 04/19/2020 0512   PCO2ART 49 (H) 04/19/2020 0512   PO2ART 89 04/19/2020 0512   HCO3 26.4 04/19/2020 0512   ACIDBASEDEF 0.4 04/18/2020 0500   O2SAT 96.3 04/19/2020 0512     Coagulation Profile: No results for input(s): INR, PROTIME in the last 168 hours.  Cardiac Enzymes: No results for input(s): CKTOTAL, CKMB, CKMBINDEX, TROPONINI in the last 168 hours.  HbA1C: Hgb A1c MFr Bld  Date/Time Value Ref Range Status  04/19/2020 04:11 AM 6.8 (H) 4.8 - 5.6 % Final    Comment:    (NOTE) Pre diabetes:          5.7%-6.4%  Diabetes:              >6.4%  Glycemic control for   <7.0% adults with diabetes     CBG: Recent Labs  Lab 04/20/20 1108 04/20/20 1531 04/20/20 2017 04/20/20 2316 04/06/2020 0420  GLUCAP 145* 202* 171* 174* 167*    Allergies No Known Allergies   Medications: Scheduled Meds: . budesonide (PULMICORT) nebulizer solution  0.5 mg Nebulization BID  . chlorhexidine gluconate (MEDLINE KIT)  15 mL Mouth Rinse BID  . Chlorhexidine Gluconate Cloth  6 each Topical Daily  . docusate  100 mg Per Tube BID  . enoxaparin (LOVENOX) injection  40 mg Subcutaneous Q24H   . feeding supplement (PROSource TF)  45 mL Per Tube Daily  . folic acid  1 mg Per Tube Daily  . free water  30 mL Per Tube Q4H  . influenza vaccine adjuvanted  0.5 mL Intramuscular Tomorrow-1000  . insulin aspart  0-20 Units Subcutaneous Q4H  . ipratropium-albuterol  3 mL Nebulization Q6H  . mouth rinse  15 mL Mouth Rinse 10 times per day  . methylPREDNISolone (SOLU-MEDROL) injection  40 mg Intravenous Q12H  . multivitamin with minerals  1 tablet Per Tube Daily  . pantoprazole sodium  40 mg Per Tube Daily  . polyethylene glycol  17 g Per  Tube Daily  . QUEtiapine  50 mg Per Tube QHS  . thiamine injection  100 mg Intravenous Q24H   Continuous Infusions: . sodium chloride    . sodium chloride 50 mL/hr at 04/17/2020 0000  .  ceFAZolin (ANCEF) IV 2 g (04/12/2020 0532)  . feeding supplement (VITAL AF 1.2 CAL) 1,000 mL (04/18/20 2147)  . HYDROmorphone 0.5 mg/hr (03/26/2020 0000)  . midazolam 1.5 mg/hr (04/05/2020 0344)  . propofol (DIPRIVAN) infusion 20 mcg/kg/min (04/13/2020 0528)   PRN Meds:.acetaminophen, docusate sodium, midazolam, ondansetron (ZOFRAN) IV, oxyCODONE-acetaminophen, polyethylene glycol, sodium chloride flush   DVT/GI PRX ordered and assessed TRANSFUSIONS AS NEEDED MONITOR FSBS I Assessed the need for Labs I Assessed the need for Foley I Assessed the need for Central Venous Line Family Discussion when available I Assessed the need for Mobilization I made an Assessment of medications to be adjusted accordingly Safety Risk assessment completed  CASE DISCUSSED IN MULTIDISCIPLINARY ROUNDS WITH ICU TEAM      Critical Care Time devoted to patient care services described in this note is 55 minutes.   Overall, patient is critically ill, prognosis is guarded.  Patient with Multiorgan failure and at high risk for cardiac arrest and death.    Corrin Parker, M.D.  [facility]  Medical Director [place] Director [facility]  Cardio-Pulmonary Department

## 2020-04-21 NOTE — Progress Notes (Signed)
*  PRELIMINARY RESULTS* Echocardiogram 2D Echocardiogram has been performed.  Joanette Gula Tayra Dawe 05-12-2020, 3:14 PM

## 2020-04-21 NOTE — Progress Notes (Signed)
PHARMACY NOTE:  ANTIMICROBIAL RENAL DOSAGE ADJUSTMENT  Current antimicrobial regimen includes a mismatch between antimicrobial dosage and estimated renal function.  As per policy approved by the Pharmacy & Therapeutics and Medical Executive Committees, the antimicrobial dosage will be adjusted accordingly.  Current antimicrobial dosage:  Cefazolin 2 g IV q8h  Indication: Pneumonia  Renal Function:  Estimated Creatinine Clearance: 20.4 mL/min (A) (by C-G formula based on SCr of 2.87 mg/dL (H)).  Antimicrobial dosage has been changed to:    Cefazolin 2 g IV q12h   Thank you for allowing pharmacy to be a part of this patient's care.  Tressie Ellis, [facility] 05-20-2020 6:18 PM

## 2020-04-22 LAB — CBC
HCT: 26.8 % — ABNORMAL LOW (ref 39.0–52.0)
Hemoglobin: 8.4 g/dL — ABNORMAL LOW (ref 13.0–17.0)
MCH: 32.6 pg (ref 26.0–34.0)
MCHC: 31.3 g/dL (ref 30.0–36.0)
MCV: 103.9 fL — ABNORMAL HIGH (ref 80.0–100.0)
Platelets: 200 10*3/uL (ref 150–400)
RBC: 2.58 MIL/uL — ABNORMAL LOW (ref 4.22–5.81)
RDW: 15.4 % (ref 11.5–15.5)
WBC: 12.1 10*3/uL — ABNORMAL HIGH (ref 4.0–10.5)
nRBC: 0 % (ref 0.0–0.2)

## 2020-04-22 LAB — RENAL FUNCTION PANEL
Albumin: 2.1 g/dL — ABNORMAL LOW (ref 3.5–5.0)
Anion gap: 8 (ref 5–15)
BUN: 111 mg/dL — ABNORMAL HIGH (ref 8–23)
CO2: 24 mmol/L (ref 22–32)
Calcium: 8.4 mg/dL — ABNORMAL LOW (ref 8.9–10.3)
Chloride: 108 mmol/L (ref 98–111)
Creatinine, Ser: 3.32 mg/dL — ABNORMAL HIGH (ref 0.61–1.24)
GFR, Estimated: 19 mL/min — ABNORMAL LOW (ref >60)
Glucose, Bld: 103 mg/dL — ABNORMAL HIGH (ref 70–99)
Phosphorus: 4.4 mg/dL (ref 2.5–4.6)
Potassium: 5.2 mmol/L — ABNORMAL HIGH (ref 3.5–5.1)
Sodium: 140 mmol/L (ref 135–145)

## 2020-04-22 LAB — MAGNESIUM: Magnesium: 2.4 mg/dL (ref 1.7–2.4)

## 2020-04-22 LAB — GLUCOSE, CAPILLARY
Glucose-Capillary: 119 mg/dL — ABNORMAL HIGH (ref 70–99)
Glucose-Capillary: 142 mg/dL — ABNORMAL HIGH (ref 70–99)
Glucose-Capillary: 145 mg/dL — ABNORMAL HIGH (ref 70–99)
Glucose-Capillary: 167 mg/dL — ABNORMAL HIGH (ref 70–99)
Glucose-Capillary: 98 mg/dL (ref 70–99)
Glucose-Capillary: 99 mg/dL (ref 70–99)

## 2020-04-22 MED ORDER — SODIUM ZIRCONIUM CYCLOSILICATE 5 G PO PACK
10.0000 g | PACK | Freq: Once | ORAL | Status: AC
  Administered 2020-04-22: 10 g
  Filled 2020-04-22: qty 2

## 2020-04-22 MED ORDER — BISACODYL 10 MG RE SUPP: 10.0000 mg | Freq: Once | RECTAL | Status: DC

## 2020-04-22 NOTE — Progress Notes (Signed)
   04/22/20 1030  Clinical Encounter Type  Visited With Patient and family together  Visit Type Initial;Psychological support;Spiritual support;Social support;Critical Care  Referral From Chaplain  Consult/Referral To Chaplain   Chaplain met Pt's brother while do his rounds. Pt's brother was able to express his emotions. Chaplain offered emotional and spiritual support.

## 2020-04-22 NOTE — Progress Notes (Signed)
PHARMACY CONSULT NOTE - FOLLOW UP  Pharmacy Consult for Electrolyte Monitoring and Replacement   Recent Labs: Potassium (mmol/L)  Date Value  04/22/2020 5.2 (H)   Magnesium (mg/dL)  Date Value  29/47/6546 2.4   Calcium (mg/dL)  Date Value  50/35/4656 8.4 (L)   Albumin (g/dL)  Date Value  81/27/5170 2.1 (L)   Phosphorus (mg/dL)  Date Value  01/74/9449 4.4   Sodium (mmol/L)  Date Value  04/22/2020 140   Assessment: Pt is a 75 y/o M who presented with falls. Pt presented with weakness and dizziness. Pt had fallen multiple times. PMH includes CVA and alcohol abuse. Patient has acute renal failure secondary to ATN and severe hyponatremia likely due to beer potomania. Pharmacy has been consulted to assist with electrolyte management and replacement as indicated.    Creatinine: SCr: 0.87 > 1.69 > 2.87 > 3.32  UOP trends: 1500 > 805 > 505 > 830  MIVF: free water 30 mL Q4h  (180 mL daily)   Electrolyte levels: - K 5.4 > 5.2 > 5.2    Mag 2.4  Phos 4.4  Na 140   Nutrition: Tube feeds @ 50 mL/hr restarted 3/25. Prosource feeds daily restarted 3/26  Lokelma 2 doses given 3/30     Goal of Therapy:  Electrolytes within normal limits  Plan:  - K: repeat Lokelma 10 g x 1 dose  - Recheck electrolytes with AM labs    Philis Fendt, Student-PharmD 04/22/2020

## 2020-04-22 NOTE — Consult Note (Signed)
Consultation Note Date: 04/22/2020   Patient Name: Adam Arnold  DOB: [birth year]  MRN: [id number]  Age / Sex: 75 y.o., male  PCP: Patient, No Pcp Per (Inactive) Referring Physician: Flora Lipps, MD  Reason for Consultation: Establishing goals of care  HPI/Patient Profile: Per EMR, this is a 75 yo male with a hx of current ETOH Abuse drinks unknown amount of alcohol daily.  He arrived to [facility] ER on 03/23 via EMS from a local motel where he has been residing over the past 2 weeks following multiple falls, and was  subsequently found crawling on the ground by the Field seismologist.  Upon EMS arrival pt noted to have multiple empty beer bottles around his room, although he reported he did not hav e a drink today.  Pt initially refused transport to the ER for treatment, therefore IVC paperwork completed due to concern of pts inability to care for himself although pt not suicidal or homicidal.     Clinical Assessment and Goals of Care: Patient is resting in bed on ventilator. No family at bedside. Spoke with patient's sister in law. She states the brothers have only spoken a couple of times in 12 years. She states he had been living with their parents until they died, but then went to the [place] and checked in and was sent somewhere for a while and was then given a place to live through [place], but would not follow rules and was asked to leave.  She states her daughter is a Marine scientist and the family has been speaking about care moving forward. She is able to articulate patient's issues accurately and states they are considering shifting to comfort care. She states they would like to meet in the morning at 9:00.        SUMMARY OF RECOMMENDATIONS   Meeting tomorrow at 9:00.     Prognosis:   Poor      Primary Diagnoses: Present on Admission: . Hyponatremia   I have reviewed the medical record, interviewed the patient and  family, and examined the patient. The following aspects are pertinent.  Past Medical History:  Diagnosis Date  . Stroke [facility])    Social History   Socioeconomic History  . Marital status: Single    Spouse name: Not on file  . Number of children: Not on file  . Years of education: Not on file  . Highest education level: Not on file  Occupational History  . Not on file  Tobacco Use  . Smoking status: Current Every Day Smoker    Packs/day: 1.00    Types: Cigarettes  . Smokeless tobacco: Never Used  Substance and Sexual Activity  . Alcohol use: Yes    Comment: 5-6 beers per day  . Drug use: Not Currently  . Sexual activity: Not on file  Other Topics Concern  . Not on file  Social History Narrative  . Not on file   Social Determinants of Health   Financial Resource Strain: Not on file  Food Insecurity: Not on file  Transportation Needs: Not on file  Physical Activity: Not on file  Stress: Not on file  Social Connections: Not on file   History reviewed. No pertinent family history. Scheduled Meds: . bisacodyl  10 mg Rectal Once  . budesonide (PULMICORT) nebulizer solution  0.5 mg Nebulization BID  . chlorhexidine gluconate (MEDLINE KIT)  15 mL Mouth Rinse BID  . Chlorhexidine Gluconate Cloth  6 each Topical Daily  . docusate  100 mg Per Tube BID  . enoxaparin (LOVENOX) injection  30 mg Subcutaneous Q24H  . feeding supplement (PROSource TF)  45 mL Per Tube Daily  . folic acid  1 mg Per Tube Daily  . free water  30 mL Per Tube Q4H  . influenza vaccine adjuvanted  0.5 mL Intramuscular Tomorrow-1000  . insulin aspart  0-20 Units Subcutaneous Q4H  . ipratropium-albuterol  3 mL Nebulization Q6H  . mouth rinse  15 mL Mouth Rinse 10 times per day  . methylPREDNISolone (SOLU-MEDROL) injection  20 mg Intravenous Daily  . multivitamin with minerals  1 tablet Per Tube Daily  . pantoprazole sodium  40 mg Per Tube Daily  . polyethylene glycol  17 g Per Tube Daily  . QUEtiapine   50 mg Per Tube QHS  . thiamine injection  100 mg Intravenous Q24H   Continuous Infusions: . sodium chloride    . sodium chloride 50 mL/hr at 04/22/20 1041  .  ceFAZolin (ANCEF) IV 2 g (04/22/20 0817)  . feeding supplement (VITAL AF 1.2 CAL) 1,000 mL (04/10/2020 2129)  . HYDROmorphone 0.5 mg/hr (04/22/20 0400)  . midazolam 1 mg/hr (04/22/20 0400)  . propofol (DIPRIVAN) infusion 20 mcg/kg/min (04/22/20 0500)   PRN Meds:.acetaminophen, docusate sodium, midazolam, ondansetron (ZOFRAN) IV, oxyCODONE-acetaminophen, polyethylene glycol, sodium chloride flush Medications Prior to Admission:  Prior to Admission medications   Not on File   No Known Allergies Review of Systems  Unable to perform ROS   Physical Exam Constitutional:      Comments: On ventilator.      Vital Signs: BP (!) 139/48   Pulse 92   Temp 99.2 F (37.3 C)   Resp 17   Ht 5' 10.98" (1.803 m)   Wt 63 kg   SpO2 96%   BMI 19.38 kg/m  Pain Scale: CPOT   Pain Score: 0-No pain   SpO2: SpO2: 96 % O2 Device:SpO2: 96 % O2 Flow Rate: .O2 Flow Rate (L/min): 15 L/min  IO: Intake/output summary:   Intake/Output Summary (Last 24 hours) at 04/22/2020 1550 Last data filed at 04/22/2020 0500 Gross per 24 hour  Intake 1417.83 ml  Output 830 ml  Net 587.83 ml    LBM: Last BM Date:  (PTA) Baseline Weight: Weight: 59.9 kg Most recent weight: Weight: 63 kg       Flowsheet Rows   Flowsheet Row Most Recent Value  Intake Tab   Referral Department Critical care  Unit at Time of Referral ICU  Date Notified 04/18/20  Palliative Care Type New Palliative care  Reason for referral Clarify Goals of Care  Date of Admission 04/08/2020  Date first seen by Palliative Care 04/19/20  # of days Palliative referral response time 1 Day(s)  # of days IP prior to Palliative referral 4  Clinical Assessment   Psychosocial & Spiritual Assessment   Palliative Care Outcomes       Time In: 3:20 Time Out: 3:50 Time Total: 30  min Greater than 50%  of this time was spent counseling and coordinating care  related to the above assessment and plan.  Signed by: Asencion Gowda, NP   Please contact Palliative Medicine Team phone at [phone] for questions and concerns.  For individual provider: See Shea Evans

## 2020-04-22 NOTE — Progress Notes (Signed)
[name], MRN:  [id number], DOB:  1945-03-18, LOS: 8 ADMISSION DATE:  03/23/2020 REFERRING MD: Larinda Buttery CHIEF COMPLAINT:  resp failure  BRIEF SYNOPSIS 75 yo male with a hx of current ETOH Abuse drinks unknown amount of alcohol daily.  He arrived to [facility] ER on 03/23 via EMS from a local motel where he has been residing over the past 2 weeks following multiple falls, and was  subsequently found crawling on the ground by the Armed forces technical officer.  Upon EMS arrival pt noted to have multiple empty beer bottles around his room, although he reported he did not hav e a drink today.  Pt initially refused transport to the ER for treatment, therefore IVC paperwork completed due to concern of pts inability to care for himself although pt not suicidal or homicidal.       Significant Hospital Events: Including procedures, antibiotic start and stop dates in addition to other pertinent events   03/23: Pt admitted to ICU with severe hyponatremia requiring 3% hypertonic saline infusion  03/27: Remains intubated, mechanically ventilated 3/28 remains intubated, severe hypoxia 3/29 severe hypoxia severe ARDS +e coli pneumonia 3/30 severe hypoixa    Interim History / Subjective:  remains on vent multiorgan failure +ganagrene of lower ext Severe hypoxia Severe ALI +etoh abuse Severe DT's E coli pneumonia   Micro Data:  E coli Pneumonia 3/27 MRSA +  Antimicrobials:   Antibiotics Given (last 72 hours)    Date/Time Action Medication Dose Rate   04/19/20 1211 New Bag/Given   ceFEPIme (MAXIPIME) 2 g in sodium chloride 0.9 % 100 mL IVPB 2 g 200 mL/hr   04/19/20 2016 New Bag/Given   ceFEPIme (MAXIPIME) 2 g in sodium chloride 0.9 % 100 mL IVPB 2 g 200 mL/hr   04/20/20 0032 New Bag/Given   vancomycin (VANCOREADY) IVPB 1250 mg/250 mL 1,250 mg 166.7 mL/hr   04/20/20 0435 New Bag/Given   ceFEPIme (MAXIPIME) 2 g in sodium chloride 0.9 % 100 mL IVPB 2 g 200 mL/hr   04/20/20 1508 New Bag/Given    ceFAZolin (ANCEF) IVPB 2g/100 mL premix 2 g 200 mL/hr   04/20/20 2211 New Bag/Given   ceFAZolin (ANCEF) IVPB 2g/100 mL premix 2 g 200 mL/hr   04/24/20 0532 New Bag/Given   ceFAZolin (ANCEF) IVPB 2g/100 mL premix 2 g 200 mL/hr   2020-04-24 2119 New Bag/Given   ceFAZolin (ANCEF) IVPB 2g/100 mL premix 2 g 200 mL/hr         Objective   Blood pressure (!) 139/48, pulse 92, temperature 99.2 F (37.3 C), resp. rate 17, height 5' 10.98" (1.803 m), weight 63 kg, SpO2 97 %.    Vent Mode: PRVC FiO2 (%):  [30 %] 30 % Set Rate:  [18 bmp] 18 bmp Vt Set:  [450 mL] 450 mL PEEP:  [5 cmH20-8 cmH20] 5 cmH20 Plateau Pressure:  [12 cmH20-17 cmH20] 17 cmH20   Intake/Output Summary (Last 24 hours) at 04/22/2020 0724 Last data filed at 04/22/2020 0500 Gross per 24 hour  Intake 1417.83 ml  Output 830 ml  Net 587.83 ml   Filed Weights   04/19/20 0500 04/20/20 0445 04/22/20 0500  Weight: 58.6 kg 63 kg 63 kg      REVIEW OF SYSTEMS  PATIENT IS UNABLE TO PROVIDE COMPLETE REVIEW OF SYSTEMS DUE TO SEVERE CRITICAL ILLNESS AND TOXIC METABOLIC ENCEPHALOPATHY   PHYSICAL EXAMINATION:  GENERAL:critically ill appearing, +resp distress HEAD: Normocephalic, atraumatic.  EYES: Pupils equal, round, reactive to light.  No scleral icterus.  MOUTH: Moist mucosal membrane. NECK: Supple. No thyromegaly. No nodules. No JVD.  PULMONARY: +rhonchi, +wheezing CARDIOVASCULAR: S1 and S2. Regular rate and rhythm. No murmurs, rubs, or gallops.  GASTROINTESTINAL: Soft, nontender, -distended. Positive bowel sounds.  MUSCULOSKELETAL: No swelling, clubbing, or edema.  NEUROLOGIC: obtunded SKIN:intact,warm,dry   Labs/imaging that I havepersonally reviewed  (right click and "Reselect all SmartList Selections" daily)   CBC    Component Value Date/Time   WBC 12.1 (H) 04/22/2020 0417   RBC 2.58 (L) 04/22/2020 0417   HGB 8.4 (L) 04/22/2020 0417   HCT 26.8 (L) 04/22/2020 0417   PLT 200 04/22/2020 0417   MCV 103.9 (H)  04/22/2020 0417   MCH 32.6 04/22/2020 0417   MCHC 31.3 04/22/2020 0417   RDW 15.4 04/22/2020 0417   LYMPHSABS 0.7 May 13, 2020 2039   MONOABS 0.9 05/13/2020 2039   EOSABS 0.0 May 13, 2020 2039   BASOSABS 0.0 05/13/2020 2039   BMP Latest Ref Rng & Units 04/22/2020 03/29/2020 04/02/2020  Glucose 70 - 99 mg/dL 751(W) - 258(N)  BUN 8 - 23 mg/dL 277(O) - 24(M)  Creatinine 0.61 - 1.24 mg/dL 3.53(I) - 1.44(R)  Sodium 135 - 145 mmol/L 140 - 138  Potassium 3.5 - 5.1 mmol/L 5.2(H) 5.2(H) 5.4(H)  Chloride 98 - 111 mmol/L 108 - 106  CO2 22 - 32 mmol/L 24 - 21(L)  Calcium 8.9 - 10.3 mg/dL 1.5(Q) - 8.3(L)        ASSESSMENT AND PLAN SYNOPSIS  75 yo male with acute and severe ADS and severe hypoxic resp failure from acute RT LUNG PNEUMONIA complicated by DT's and severe ETOH abuse With severe hyponatremia and levated troponin likely secondary to demand ischemia Acute renal failure with anion gap metabolic acidosis secondary to ATN with acute aspiration pneumonia with E coli pneumonia and gangrene of lower extremity   Severe ACUTE Hypoxic and Hypercapnic Respiratory Failure -continue Mechanical Ventilator support -continue Bronchodilator Therapy -Wean Fio2 and PEEP as tolerated -VAP/VENT bundle implementation  SEVERE COPD EXACERBATION -continue IV steroids as prescribed -continue NEB THERAPY as prescribed -morphine as needed -wean fio2 as needed and tolerated   SEVERE ALCOHOL WITHDRAWAL -Therapy with Thiamine and MVI -CIWA Protocol -Precedex as needd   CARDIAC-elevated troponin demand ischemic  CARDIAC ICU monitoring   ACUTE KIDNEY INJURY/Renal Failure -continue Foley Catheter-assess need -Avoid nephrotoxic agents -Follow urine output, BMP -Ensure adequate renal perfusion, optimize oxygenation -Renal dose medications Family has refused HD at this time   NEUROLOGY Acute toxic metabolic encephalopathy, need for sedation Goal RASS -2 to -3   SEPTIC SHOCK -use vasopressors  to keep MAP>65 as needed  INFECTIOUS DISEASE -continue antibiotics as prescribed -follow up cultures E coli pneumonia   ENDO - ICU hypoglycemic\Hyperglycemia protocol -check FSBS per protocol   GI GI PROPHYLAXIS as indicated  NUTRITIONAL STATUS DIET-->TF's as tolerated Constipation protocol as indicated   ELECTROLYTES -follow labs as needed -replace as needed -pharmacy consultation and following     Best practice (right click and "Reselect all SmartList Selections" daily)  Diet:  Tube Feed  Pain/Anxiety/Delirium protocol (if indicated): Yes (RASS goal -1) VAP protocol (if indicated): Yes DVT prophylaxis: LMWH GI prophylaxis: PPI Glucose control:  SSI Yes Central venous access:  Yes, and it is still needed Arterial line:  N/A Foley:  Yes, and it is still needed Mobility:  bed rest  Code Status:  DNR Disposition: ICU  Labs   CBC: Recent Labs  Lab 04/18/20 0441 04/19/20 0411 04/20/20 0430 04/07/2020 0625 04/22/20 0417  WBC 9.8 16.3*  17.4* 14.9* 12.1*  HGB 10.5* 9.5* 9.1* 9.1* 8.4*  HCT 31.7* 28.4* 28.8* 29.1* 26.8*  MCV 98.8 99.3 104.0* 104.3* 103.9*  PLT 196 192 215 212 200    Basic Metabolic Panel: Recent Labs  Lab 04/18/20 0441 04/18/20 1135 04/19/20 0411 04/19/20 2214 04/20/20 0430 05-13-20 0501 05/13/2020 0502 05-13-2020 1810 04/22/20 0417  NA 133*   < > 137 138 136  --  138  --  140  K 4.8  --  4.7  --  4.7  --  5.4* 5.2* 5.2*  CL 101  --  104  --  104  --  106  --  108  CO2 27  --  27  --  24  --  21*  --  24  GLUCOSE 119*  --  201*  --  196*  --  162*  --  103*  BUN 32*  --  42*  --  64*  --  96*  --  111*  CREATININE 0.79  --  0.87  --  1.69*  --  2.87*  --  3.32*  CALCIUM 8.6*  --  8.5*  --  8.9  --  8.3*  --  8.4*  MG 1.9  --  1.7  --  2.1 2.3  --   --  2.4  PHOS 3.6  --  2.7  --  4.9*  --  5.4*  --  4.4   < > = values in this interval not displayed.   GFR: Estimated Creatinine Clearance: 17.7 mL/min (A) (by C-G formula based on  SCr of 3.32 mg/dL (H)). Recent Labs  Lab 04/15/20 0951 04/16/20 0420 04/17/20 1145 04/18/20 0441 04/19/20 0411 04/20/20 0430 2020/05/13 0625 04/22/20 0417  PROCALCITON  --   --  0.33 0.53 0.50  --   --   --   WBC  --    < >  --  9.8 16.3* 17.4* 14.9* 12.1*  LATICACIDVEN 0.9  --   --   --   --   --   --   --    < > = values in this interval not displayed.    Liver Function Tests: Recent Labs  Lab 04/16/20 0420 04/17/20 0440 04/18/20 0441 2020-05-13 0502 04/22/20 0417  AST  --   --  24  --   --   ALT  --   --  16  --   --   ALKPHOS  --   --  48  --   --   BILITOT  --   --  0.5  --   --   PROT  --   --  5.6*  --   --   ALBUMIN 3.5 3.3* 2.8* 2.3* 2.1*   No results for input(s): LIPASE, AMYLASE in the last 168 hours. No results for input(s): AMMONIA in the last 168 hours.  ABG    Component Value Date/Time   PHART 7.34 (L) 04/19/2020 0512   PCO2ART 49 (H) 04/19/2020 0512   PO2ART 89 04/19/2020 0512   HCO3 26.4 04/19/2020 0512   ACIDBASEDEF 0.4 04/18/2020 0500   O2SAT 96.3 04/19/2020 0512     Coagulation Profile: No results for input(s): INR, PROTIME in the last 168 hours.  Cardiac Enzymes: No results for input(s): CKTOTAL, CKMB, CKMBINDEX, TROPONINI in the last 168 hours.  HbA1C: Hgb A1c MFr Bld  Date/Time Value Ref Range Status  04/19/2020 04:11 AM 6.8 (H) 4.8 - 5.6 % Final    Comment:    (  NOTE) Pre diabetes:          5.7%-6.4%  Diabetes:              >6.4%  Glycemic control for   <7.0% adults with diabetes     CBG: Recent Labs  Lab 17-May-2020 1515 05-17-20 1920 May 17, 2020 2322 04/22/20 0408 04/22/20 0716  GLUCAP 137* 139* 107* 98 119*    Allergies No Known Allergies     DVT/GI PRX  assessed I Assessed the need for Labs I Assessed the need for Foley I Assessed the need for Central Venous Line Family Discussion when available I Assessed the need for Mobilization I made an Assessment of medications to be adjusted accordingly Safety Risk  assessment completed  CASE DISCUSSED IN MULTIDISCIPLINARY ROUNDS WITH ICU TEAM     Critical Care Time devoted to patient care services described in this note is 65  minutes.   Overall, patient is critically ill, prognosis is guarded.  Patient with Multiorgan failure and at high risk for cardiac arrest and death.   Patient with multiorgan failure with suffering and dying process Recommend comfort care measures  Gretna Bergin Santiago Glad, M.D.  [facility]  Medical Director [facility] Medical Director [facility] Cardio-Pulmonary Department

## 2020-04-23 DIAGNOSIS — Z515 Encounter for palliative care: Secondary | ICD-10-CM

## 2020-04-23 LAB — CBC
HCT: 26 % — ABNORMAL LOW (ref 39.0–52.0)
Hemoglobin: 8.4 g/dL — ABNORMAL LOW (ref 13.0–17.0)
MCH: 32.7 pg (ref 26.0–34.0)
MCHC: 32.3 g/dL (ref 30.0–36.0)
MCV: 101.2 fL — ABNORMAL HIGH (ref 80.0–100.0)
Platelets: 176 10*3/uL (ref 150–400)
RBC: 2.57 MIL/uL — ABNORMAL LOW (ref 4.22–5.81)
RDW: 15.7 % — ABNORMAL HIGH (ref 11.5–15.5)
WBC: 11.7 10*3/uL — ABNORMAL HIGH (ref 4.0–10.5)
nRBC: 0 % (ref 0.0–0.2)

## 2020-04-23 LAB — RENAL FUNCTION PANEL
Albumin: 2 g/dL — ABNORMAL LOW (ref 3.5–5.0)
Anion gap: 7 (ref 5–15)
BUN: 127 mg/dL — ABNORMAL HIGH (ref 8–23)
CO2: 24 mmol/L (ref 22–32)
Calcium: 8 mg/dL — ABNORMAL LOW (ref 8.9–10.3)
Chloride: 108 mmol/L (ref 98–111)
Creatinine, Ser: 3.29 mg/dL — ABNORMAL HIGH (ref 0.61–1.24)
GFR, Estimated: 19 mL/min — ABNORMAL LOW (ref >60)
Glucose, Bld: 114 mg/dL — ABNORMAL HIGH (ref 70–99)
Phosphorus: 5.3 mg/dL — ABNORMAL HIGH (ref 2.5–4.6)
Potassium: 5 mmol/L (ref 3.5–5.1)
Sodium: 139 mmol/L (ref 135–145)

## 2020-04-23 LAB — GLUCOSE, CAPILLARY
Glucose-Capillary: 104 mg/dL — ABNORMAL HIGH (ref 70–99)
Glucose-Capillary: 146 mg/dL — ABNORMAL HIGH (ref 70–99)

## 2020-04-23 LAB — TRIGLYCERIDES: Triglycerides: 110 mg/dL (ref <150)

## 2020-04-23 LAB — MAGNESIUM: Magnesium: 2.3 mg/dL (ref 1.7–2.4)

## 2020-04-23 MED ORDER — SODIUM CHLORIDE 0.9 % IV SOLN
0.5000 mg/h | INTRAVENOUS | Status: DC
  Filled 2020-04-23: qty 2.5

## 2020-04-23 MED ORDER — GLYCOPYRROLATE 0.2 MG/ML IJ SOLN
0.2000 mg | Freq: Once | INTRAMUSCULAR | Status: AC
  Administered 2020-04-23: 0.2 mg via INTRAVENOUS
  Filled 2020-04-23: qty 1

## 2020-04-23 MED ORDER — HYDROMORPHONE BOLUS VIA INFUSION
1.0000 mg | INTRAVENOUS | Status: DC | PRN
  Filled 2020-04-23: qty 1

## 2020-04-23 MED ORDER — GLYCOPYRROLATE 0.2 MG/ML IJ SOLN: 0.4000 mg | INTRAMUSCULAR | Status: DC | PRN

## 2020-04-23 DEATH — deceased

## 2020-04-29 LAB — BLOOD GAS, ARTERIAL
Acid-Base Excess: 0.3 mmol/L (ref 0.0–2.0)
Bicarbonate: 26.4 mmol/L (ref 20.0–28.0)
FIO2: 0.85
O2 Saturation: 96.3 %
PEEP: 1 cmH2O
Patient temperature: 37
RATE: 18 resp/min
pCO2 arterial: 49 mmHg — ABNORMAL HIGH (ref 32.0–48.0)
pH, Arterial: 7.34 — ABNORMAL LOW (ref 7.350–7.450)
pO2, Arterial: 89 mmHg (ref 83.0–108.0)

## 2020-05-23 NOTE — Progress Notes (Addendum)
Daily Progress Note   Patient Name: Adam Arnold       Date: 2020-05-13 DOB: March 15, 1945  Age: 75 y.o. MRN#: [id number] Attending Physician: Flora Lipps, MD Primary Care Physician: Patient, No Pcp Per (Inactive) Admit Date: 04/11/2020  Reason for Consultation/Follow-up: Establishing goals of care  Subjective:  Patient is resting in bed on ventilator. Brother and his wife are at bedside. He confirms patient's parents have died and he is the only sibling. He has no children and is not married. Brother discusses patient's past and alcohol use.  He has been well updated and articulates his brother's status very well. He states he has already decided to shift to comfort, but still talks out the situation during our meeting. We discussed "what if's" and different scenarios. Discussed acceptable QOL.  He confirms wanting to transition to comfort care at this time.   I completed a MOST form today with brother and the signed original was placed in the chart. A photocopy was placed in the chart to be scanned into EMR. The patient outlined their wishes for the following treatment decisions:  Cardiopulmonary Resuscitation: Do Not Attempt Resuscitation (DNR/No CPR)  Medical Interventions: Comfort Measures: Keep clean, warm, and dry. Use medication by any route, positioning, wound care, and other measures to relieve pain and suffering. Use oxygen, suction and manual treatment of airway obstruction as needed for comfort. Do not transfer to the hospital unless comfort needs cannot be met in current location.  Antibiotics: No antibiotics (use other measures to relieve symptoms)  IV Fluids: No IV fluids (provide other measures to ensure comfort)  Feeding Tube: No feeding tube       Length of Stay: 9  Current  Medications: Scheduled Meds:  . glycopyrrolate  0.2 mg Intravenous Once  . methylPREDNISolone (SOLU-MEDROL) injection  20 mg Intravenous Daily    Continuous Infusions: . HYDROmorphone 0.5 mg/hr (05/13/2020 0103)  . midazolam 3 mg/hr (04/22/20 1859)    PRN Meds:   Physical Exam Constitutional:      Comments: On ventilator. Intubated.              Vital Signs: BP (!) 147/50   Pulse 90   Temp 99.4 F (37.4 C) (Axillary)   Resp 20   Ht 5' 10.98" (1.803 m)   Wt 63 kg   SpO2  94%   BMI 19.38 kg/m  SpO2: SpO2: 94 % O2 Device: O2 Device: Ventilator O2 Flow Rate: O2 Flow Rate (L/min): 15 L/min  Intake/output summary:   Intake/Output Summary (Last 24 hours) at May 08, 2020 1022 Last data filed at May 08, 2020 0755 Gross per 24 hour  Intake 2756.97 ml  Output 2375 ml  Net 381.97 ml   LBM: Last BM Date: May 08, 2020 Baseline Weight: Weight: 59.9 kg Most recent weight: Weight: 63 kg         Flowsheet Rows   Flowsheet Row Most Recent Value  Intake Tab   Referral Department Critical care  Unit at Time of Referral ICU  Date Notified 04/18/20  Palliative Care Type New Palliative care  Reason for referral Clarify Goals of Care  Date of Admission 04/17/2020  Date first seen by Palliative Care 04/19/20  # of days Palliative referral response time 1 Day(s)  # of days IP prior to Palliative referral 4  Clinical Assessment   Psychosocial & Spiritual Assessment   Palliative Care Outcomes       Patient Active Problem List   Diagnosis Date Noted  . Protein-calorie malnutrition, severe 04/16/2020  . Alcohol abuse 04/15/2020  . Acute delirium 04/15/2020  . Hyponatremia 04/05/2020    Palliative Care Assessment & Plan    Recommendations/Plan: Shift to comfort care.  Active Versed order is currently titratable.  Increased Dilaudid infusion for pain or dyspnea. Spoke with RN about titrating the Versed as needed for his comfort. Robinul ordered PRN for excessive secretions.    Addendum: Nurse called regarding concerns for discomfort. Dilaudid boluses via infusion ordered PRN for pain or dyspnea.     Code Status:    Code Status Orders  (From admission, onward)         Start     Ordered   03/30/2020 1333  Do not attempt resuscitation (DNR)  Continuous       Question Answer Comment  In the event of cardiac or respiratory ARREST Do not call a "code blue"   In the event of cardiac or respiratory ARREST Do not perform Intubation, CPR, defibrillation or ACLS   In the event of cardiac or respiratory ARREST Use medication by any route, position, wound care, and other measures to relive pain and suffering. May use oxygen, suction and manual treatment of airway obstruction as needed for comfort.      03/29/2020 1333        Code Status History    Date Active Date Inactive Code Status Order ID Comments User Context   04/11/2020 2211 04/07/2020 1333 Full Code [id number]  Awilda Bill, NP ED   Advance Care Planning Activity      Prognosis:  Hours - Days    Care plan was discussed with CCM and RN  Thank you for allowing the Palliative Medicine Team to assist in the care of this patient.   Total Time 35 min Prolonged Time Billed no      Greater than 50%  of this time was spent counseling and coordinating care related to the above assessment and plan.  Asencion Gowda, NP  Please contact Palliative Medicine Team phone at [phone] for questions and concerns.

## 2020-05-23 NOTE — Progress Notes (Signed)
NAMEAden Arnold, MRN:  [id number], DOB:  03/03/1945, LOS: 9 ADMISSION DATE:  2020/04/24 REFERRING MD: Adam Arnold CHIEF COMPLAINT:  resp failure  BRIEF SYNOPSIS 75 yo male with a hx of current ETOH Abuse drinks unknown amount of alcohol daily.  He arrived to [facility] ER on 03/23 via EMS from a local motel where he has been residing over the past 2 weeks following multiple falls, and was  subsequently found crawling on the ground by the [employer] forces technical officer.  Upon EMS arrival pt noted to have multiple empty beer bottles around his room, although he reported he did not hav e a drink today.  Pt initially refused transport to the ER for treatment, therefore IVC paperwork completed due to concern of pts inability to care for himself although pt not suicidal or homicidal.       Significant Hospital Events: Including procedures, antibiotic start and stop dates in addition to other pertinent events   03/23: Pt admitted to ICU with severe hyponatremia requiring 3% hypertonic saline infusion  03/27: Remains intubated, mechanically ventilated 3/28 remains intubated, severe hypoxia 3/29 severe hypoxia severe ARDS +e coli pneumonia 3/30 severe hypoxia 3/31 severe resp failure, multiorgan failure    Interim History / Subjective:  Remains on vent Critically ill Multiorgan failure +gangrene of lower ext Severe hypoxia Severe DT's E coli pneumonia progressive renal failure   Micro Data:  E coli Pneumonia 3/27 MRSA +  Antimicrobials:   Antibiotics Given (last 72 hours)    Date/Time Action Medication Dose Rate   04/20/20 1508 New Bag/Given   ceFAZolin (ANCEF) IVPB 2g/100 mL premix 2 g 200 mL/hr   04/20/20 2211 New Bag/Given   ceFAZolin (ANCEF) IVPB 2g/100 mL premix 2 g 200 mL/hr   04/22/2020 0532 New Bag/Given   ceFAZolin (ANCEF) IVPB 2g/100 mL premix 2 g 200 mL/hr   04/10/2020 2119 New Bag/Given   ceFAZolin (ANCEF) IVPB 2g/100 mL premix 2 g 200 mL/hr   04/22/20 0817 New Bag/Given   ceFAZolin (ANCEF)  IVPB 2g/100 mL premix 2 g 200 mL/hr   04/22/20 2034 New Bag/Given   ceFAZolin (ANCEF) IVPB 2g/100 mL premix 2 g 200 mL/hr         Objective   Blood pressure (!) 144/49, pulse 91, temperature 99.1 F (37.3 C), resp. rate 15, height 5' 10.98" (1.803 m), weight 63 kg, SpO2 90 %.    Vent Mode: PRVC FiO2 (%):  [28 %] 28 % Set Rate:  [18 bmp] 18 bmp Vt Set:  [450 mL] 450 mL PEEP:  [5 cmH20] 5 cmH20   Intake/Output Summary (Last 24 hours) at 05/13/2020 0719 Last data filed at 05/21/2020 0400 Gross per 24 hour  Intake 2756.97 ml  Output 2050 ml  Net 706.97 ml   Filed Weights   04/19/20 0500 04/20/20 0445 04/22/20 0500  Weight: 58.6 kg 63 kg 63 kg   REVIEW OF SYSTEMS  PATIENT IS UNABLE TO PROVIDE COMPLETE REVIEW OF SYSTEMS DUE TO SEVERE CRITICAL ILLNESS AND TOXIC METABOLIC ENCEPHALOPATHY  PHYSICAL EXAMINATION:  GENERAL:critically ill appearing, +resp distress HEAD: Normocephalic, atraumatic.  EYES: Pupils equal, round, reactive to light.  No scleral icterus.  MOUTH: Moist mucosal membrane. NECK: Supple. No thyromegaly. No nodules. No JVD.  PULMONARY: +rhonchi, +wheezing CARDIOVASCULAR: S1 and S2. Regular rate and rhythm. No murmurs, rubs, or gallops.  GASTROINTESTINAL: Soft, nontender, -distended. Positive bowel sounds.  UROLOGIC: obtunded SKIN:+ganagrene    Labs/imaging that I havepersonally reviewed  (right click and "Reselect all SmartList Selections" daily)  CBC    Component Value Date/Time   WBC 11.7 (H) 05/02/2020 0436   RBC 2.57 (L) 04/29/2020 0436   HGB 8.4 (L) 04/25/2020 0436   HCT 26.0 (L) 05/07/2020 0436   PLT 176 05/19/2020 0436   MCV 101.2 (H) 05/12/2020 0436   MCH 32.7 05/08/2020 0436   MCHC 32.3 04/26/2020 0436   RDW 15.7 (H) 04/30/2020 0436   LYMPHSABS 0.7 04/01/2020 2039   MONOABS 0.9 04/15/2020 2039   EOSABS 0.0 04/19/2020 2039   BASOSABS 0.0 04/06/2020 2039   BMP Latest Ref Rng & Units 05/01/2020 04/22/2020 04/22/2020  Glucose 70 - 99 mg/dL  409(W) 119(J) -  BUN 8 - 23 mg/dL 478(G) 956(O) -  Creatinine 0.61 - 1.24 mg/dL 1.30(Q) 6.57(Q) -  Sodium 135 - 145 mmol/L 139 140 -  Potassium 3.5 - 5.1 mmol/L 5.0 5.2(H) 5.2(H)  Chloride 98 - 111 mmol/L 108 108 -  CO2 22 - 32 mmol/L 24 24 -  Calcium 8.9 - 10.3 mg/dL 8.0(L) 8.4(L) -        ASSESSMENT AND PLAN SYNOPSIS  75 yo male with acute and severe ADS and severe hypoxic resp failure from acute RT LUNG PNEUMONIA complicated by DT's and severe ETOH abuse With severe hyponatremia and levated troponin likely secondary to demand ischemia Acute renal failure with anion gap metabolic acidosis secondary to ATN with acute aspiration pneumonia with E coli pneumonia and gangrene of lower extremity  Severe ACUTE Hypoxic and Hypercapnic Respiratory Failure -continue Mechanical Ventilator support -continue Bronchodilator Therapy -Wean Fio2 and PEEP as tolerated -VAP/VENT bundle implementation  SEVERE COPD EXACERBATION -continue IV steroids as prescribed -continue NEB THERAPY as prescribed -morphine as needed -wean fio2 as needed and tolerated    SEVERE ALCOHOL WITHDRAWAL -Therapy with Thiamine and MVI   CARDIAC-elevated troponin demand ischemic  CARDIAC ICU monitoring  ACUTE KIDNEY INJURY/Renal Failure -continue Foley Catheter-assess need -Avoid nephrotoxic agents -Follow urine output, BMP -Ensure adequate renal perfusion, optimize oxygenation -Renal dose medications Family has refused HD at this time   NEUROLOGY ACUTE TOXIC METABOLIC ENCEPHALOPATHY -need for sedation -Goal RASS -2 to -3  Septic shock -use vasopressors to keep MAP>65 as needed  INFECTIOUS DISEASE -continue antibiotics as prescribed -follow up cultures -follow up ID consultation    GI GI PROPHYLAXIS as indicated  NUTRITIONAL STATUS DIET-->TF's as tolerated Constipation protocol as indicated  ELECTROLYTES -follow labs as needed -replace as needed -pharmacy consultation and  following       Best practice (right click and "Reselect all SmartList Selections" daily)  Diet:  Tube Feed  Pain/Anxiety/Delirium protocol (if indicated): Yes (RASS goal -1) VAP protocol (if indicated): Yes DVT prophylaxis: LMWH GI prophylaxis: PPI Glucose control:  SSI Yes Central venous access:  Yes, and it is still needed Arterial line:  N/A Foley:  Yes, and it is still needed Mobility:  bed rest  Code Status:  DNR Disposition: ICU  Labs   CBC: Recent Labs  Lab 04/19/20 0411 04/20/20 0430 04/22/2020 0625 04/22/20 0417 05/08/2020 0436  WBC 16.3* 17.4* 14.9* 12.1* 11.7*  HGB 9.5* 9.1* 9.1* 8.4* 8.4*  HCT 28.4* 28.8* 29.1* 26.8* 26.0*  MCV 99.3 104.0* 104.3* 103.9* 101.2*  PLT 192 215 212 200 176    Basic Metabolic Panel: Recent Labs  Lab 04/19/20 0411 04/19/20 2214 04/20/20 0430 04-22-20 0501 April 22, 2020 0502 22-Apr-2020 1810 04/22/20 0417 05/19/2020 0436  NA 137 138 136  --  138  --  140 139  K 4.7  --  4.7  --  5.4* 5.2* 5.2* 5.0  CL 104  --  104  --  106  --  108 108  CO2 27  --  24  --  21*  --  24 24  GLUCOSE 201*  --  196*  --  162*  --  103* 114*  BUN 42*  --  64*  --  96*  --  111* 127*  CREATININE 0.87  --  1.69*  --  2.87*  --  3.32* 3.29*  CALCIUM 8.5*  --  8.9  --  8.3*  --  8.4* 8.0*  MG 1.7  --  2.1 2.3  --   --  2.4 2.3  PHOS 2.7  --  4.9*  --  5.4*  --  4.4 5.3*   GFR: Estimated Creatinine Clearance: 17.8 mL/min (A) (by C-G formula based on SCr of 3.29 mg/dL (H)). Recent Labs  Lab 04/17/20 1145 04/18/20 0441 04/19/20 0411 04/20/20 0430 04/24/2020 0625 04/22/20 0417 04/28/2020 0436  PROCALCITON 0.33 0.53 0.50  --   --   --   --   WBC  --  9.8 16.3* 17.4* 14.9* 12.1* 11.7*    Liver Function Tests: Recent Labs  Lab 04/17/20 0440 04/18/20 0441 Apr 24, 2020 0502 04/22/20 0417 05/10/2020 0436  AST  --  24  --   --   --   ALT  --  16  --   --   --   ALKPHOS  --  48  --   --   --   BILITOT  --  0.5  --   --   --   PROT  --  5.6*  --   --   --    ALBUMIN 3.3* 2.8* 2.3* 2.1* 2.0*   No results for input(s): LIPASE, AMYLASE in the last 168 hours. No results for input(s): AMMONIA in the last 168 hours.  ABG    Component Value Date/Time   PHART 7.34 (L) 04/19/2020 0512   PCO2ART 49 (H) 04/19/2020 0512   PO2ART 89 04/19/2020 0512   HCO3 26.4 04/19/2020 0512   ACIDBASEDEF 0.4 04/18/2020 0500   O2SAT 96.3 04/19/2020 0512     Coagulation Profile: No results for input(s): INR, PROTIME in the last 168 hours.  Cardiac Enzymes: No results for input(s): CKTOTAL, CKMB, CKMBINDEX, TROPONINI in the last 168 hours.  HbA1C: Hgb A1c MFr Bld  Date/Time Value Ref Range Status  04/19/2020 04:11 AM 6.8 (H) 4.8 - 5.6 % Final    Comment:    (NOTE) Pre diabetes:          5.7%-6.4%  Diabetes:              >6.4%  Glycemic control for   <7.0% adults with diabetes     CBG: Recent Labs  Lab 04/22/20 1539 04/22/20 1952 04/22/20 2336 05/20/2020 0357 04/29/2020 0711  GLUCAP 167* 99 142* 104* 146*    Allergies No Known Allergies     DVT/GI PRX  assessed I Assessed the need for Labs I Assessed the need for Foley I Assessed the need for Central Venous Line Family Discussion when available I Assessed the need for Mobilization I made an Assessment of medications to be adjusted accordingly Safety Risk assessment completed  CASE DISCUSSED IN MULTIDISCIPLINARY ROUNDS WITH ICU TEAM     Critical Care Time devoted to patient care services described in this note is 46 minutes.   Overall, patient is critically ill, prognosis is guarded.  Patient with Multiorgan failure and  at high risk for cardiac arrest and death.    Lucie Leather, M.D.  [facility]  Medical Director [facility] Medical Director [facility] Cardio-Pulmonary Department

## 2020-05-23 NOTE — Progress Notes (Signed)
1515 on 4/1 Contacted ME Waldemar Dickens since patient had fallen multiple times prior to admission to Hospital. Review circumstances with him. He declined this as a medical examiner's case.

## 2020-05-23 NOTE — Progress Notes (Signed)
Patient extubated to comfort care per MD orders.  

## 2020-05-23 NOTE — Death Summary Note (Signed)
DEATH SUMMARY   Patient Details  NameChristine Arnold MRN: [id number] DOB: [birth year]  Admission/Discharge Information   Admit Date:  04/25/20  Date of Death:   May 04, 2020   Time of Death:  1351  Length of Stay: 9  Referring Physician: Patient, No Pcp Per (Inactive)   Reason(s) for Hospitalization  75 yo male with a hx of current ETOH Abuse drinks unknown amount of alcohol daily. He arrived to [facility] ER on 26-Apr-2022 via EMS from a local motel where he has been residing over the past 2 weeks following multiple falls, and was subsequently found crawling on the ground by the Armed forces technical officer. Upon EMS arrival pt noted to have multiple empty beer bottles around his room, although he reported he did not hav e a drink today. Pt initially refused transport to the ER for treatment, therefore IVC paperwork completed due to concern of pts inability to care for himself although pt not suicidal or homicidal.     Significant Hospital Events: Including procedures, antibiotic start and stop dates in addition to other pertinent events   04/26/2022: Pt admitted to ICU with severe hyponatremia requiring 3% hypertonic saline infusion  03/27:Remains intubated, mechanically ventilated 3/28remains intubated, severe hypoxia 3/29 severe hypoxia severe ARDS+e coli pneumonia 3/30 severe hypoxia 3/31 severe resp failure, multiorgan failure    Interim History / Subjective:  Remains on vent Critically ill Multiorgan failure +gangrene of lower ext Severe hypoxia Severe DT's E coli pneumonia progressive renal failure   Micro Data:  E coli Pneumonia 3/27 MRSA +  75 yo male with acute and severe ADS and severe hypoxic resp failure from acute RT LUNG PNEUMONIA complicated by DT's and severe ETOH abuse With severe hyponatremiaand levated troponin likely secondary to demand ischemia Acute renal failure with anion gap metabolic acidosis secondary to ATN with acute aspiration pneumonia with E coli pneumonia  and gangrene of lower extremity  Severe ACUTE Hypoxic and Hypercapnic Respiratory Failure -continue Mechanical Ventilator support -continue Bronchodilator Therapy -Wean Fio2 and PEEP as tolerated -VAP/VENT bundle implementation  SEVERE COPD EXACERBATION -continue IV steroids as prescribed -continue NEB THERAPY as prescribed -morphine as needed -wean fio2 as needed and tolerated    SEVERE ALCOHOL WITHDRAWAL -Therapy with Thiamine and MVI   CARDIAC-elevated troponin demand ischemic  CARDIAC ICU monitoring  ACUTE KIDNEY INJURY/Renal Failure -continue Foley Catheter-assess need -Avoid nephrotoxic agents -Follow urine output, BMP -Ensure adequate renal perfusion, optimize oxygenation -Renal dose medications Family has refused HD at this time   NEUROLOGY ACUTE TOXIC METABOLIC ENCEPHALOPATHY -need for sedation -Goal RASS -2 to -3  Septic shock -use vasopressors to keep MAP>65 as needed  INFECTIOUS DISEASE -continue antibiotics as prescribed -follow up cultures -follow up ID consultation    GI GI PROPHYLAXIS as indicated  NUTRITIONAL STATUS DIET-->TF's as tolerated Constipation protocol as indicated  ELECTROLYTES -follow labs as needed -replace as needed -pharmacy consultation and following   GOALS OF CARE DISCUSSION  The Clinical status was relayed to family in detail.  Updated and notified of patients medical condition.  Patient remains unresponsive and will not open eyes to command.    Patient is having a weak cough and struggling to remove secretions.   Patient with increased WOB and using accessory muscles to breathe Explained to family course of therapy and the modalities     Patient with Progressive multiorgan failure with a very high probablity of a very minimal chance of meaningful recovery despite all aggressive and optimal medical therapy. Patient is in the Dying  Process associated with Suffering.  PATIENT REMAINS  FULL CODE  Family understands the situation.  They have consented and agreed to DNR/DNI and would like to proceed with Comfort care measures.  Family are satisfied with Plan of action and management. All questions answered     Diagnoses  Preliminary cause of death: ISCHEMIC CARDIOMYOPATHY, E coli Pneumonia Secondary Diagnoses (including complications and co-morbidities):  Principal Problem:   Acute delirium Active Problems:   Hyponatremia   Alcohol abuse   Protein-calorie malnutrition, severe     Pertinent Labs and Studies  Significant Diagnostic Studies DG Chest 2 View  Result Date: 03/30/2020 CLINICAL DATA:  75 year old male with weakness.  Fall. EXAM: CHEST - 2 VIEW COMPARISON:  Chest radiograph dated 11/22/2009. FINDINGS: Background of mild emphysema and chronic bronchitic changes. No focal consolidation, pleural effusion or pneumothorax. The cardiac silhouette is within limits. Osteopenia with mild degenerative changes of the spine. Apparent focal area of cortical irregularity of the anterior left fifth rib may be artifactual or represent a nondisplaced fracture. Correlation with point tenderness recommended. IMPRESSION: 1. No active cardiopulmonary disease. 2. Artifact versus a nondisplaced fracture of the anterior left fifth rib. Electronically Signed   By: Elgie Collard M.D.   On: 04/10/2020 21:19   CT Head Wo Contrast  Result Date: 03/23/2020 CLINICAL DATA:  Larey Seat, facial abrasions EXAM: CT HEAD WITHOUT CONTRAST TECHNIQUE: Contiguous axial images were obtained from the base of the skull through the vertex without intravenous contrast. COMPARISON:  11/22/2009 FINDINGS: Brain: Hypodensities in the bilateral frontal periventricular white matter consistent with chronic small vessel ischemic changes. Evidence of prior right frontal burr hole with minimal right frontal cortical encephalomalacia. No signs of acute infarct or hemorrhage. Lateral ventricles and midline structures  are unremarkable. No acute extra-axial fluid collections. No mass effect. Vascular: No hyperdense vessel or unexpected calcification. Skull: Prior right frontal burr hole. Remainder of the calvarium is unremarkable. Sinuses/Orbits: Diffuse mucoperiosteal thickening throughout the ethmoid and maxillary sinuses. Other: None. IMPRESSION: 1. No acute intracranial process. 2. Chronic small vessel ischemic changes in the bilateral frontal white matter. Electronically Signed   By: Sharlet Salina M.D.   On: 04/11/2020 21:13   CT ANGIO CHEST PE W OR WO CONTRAST  Result Date: 04/17/2020 CLINICAL DATA:  Worsening respiratory failure, chest pain, increased oxygen demand EXAM: CT ANGIOGRAPHY CHEST WITH CONTRAST TECHNIQUE: Multidetector CT imaging of the chest was performed using the standard protocol during bolus administration of intravenous contrast. Multiplanar CT image reconstructions and MIPs were obtained to evaluate the vascular anatomy. CONTRAST:  73mL OMNIPAQUE IOHEXOL 350 MG/ML SOLN COMPARISON:  04/17/2020 FINDINGS: Cardiovascular: This is a technically adequate evaluation of the pulmonary vasculature. No filling defects or pulmonary emboli. The heart is unremarkable without pericardial effusion. Normal caliber of the thoracic aorta without dissection. Atherosclerosis of the aortic arch and coronary vessels. Mediastinum/Nodes: Endotracheal tube well above carina. Enteric catheter extends into the gastric lumen. Right-sided PICC tip within the superior vena cava. No pathologic adenopathy. Lungs/Pleura: There is dense right lower lobe consolidation, with relative decreased attenuation of the right lung, with fluid filling the right lower lobe bronchus. There is some volume loss, and this is likely a combination of pneumonia and atelectasis. Dependent consolidation within the right upper lobe and consolidation lateral segment right lower lobe likely reflect atelectasis. Emphysematous changes are seen throughout the  left lung, with hyperexpansion. Trace right pleural effusion. No pneumothorax. Upper Abdomen: Indeterminate 2 cm hypodensity right kidney. Hounsfield attenuation is approximately 50. Remainder of the  upper abdomen is unremarkable. Musculoskeletal: There are no acute or destructive bony lesions. Reconstructed images demonstrate no additional findings. Review of the MIP images confirms the above findings. IMPRESSION: 1. Consolidation throughout the right lung, most pronounced within the right lower lobe, with underlying volume loss. The right lower lobe is completely consolidated and relatively decreased in attenuation, with fluid filling the right lower lobe bronchus, concerning for combination of pneumonia and atelectasis. 2. No evidence of pulmonary embolus. 3. Indeterminate 2 cm hypodensity right kidney. Nonemergent follow-up ultrasound may be useful to assess for cystic versus solid abnormality. 4. Aortic Atherosclerosis (ICD10-I70.0) and Emphysema (ICD10-J43.9). 5. Support devices as above. Electronically Signed   By: Sharlet Salina M.D.   On: 04/17/2020 16:58   CT Cervical Spine Wo Contrast  Result Date: 05/14/20 CLINICAL DATA:  Larey Seat, facial abrasions EXAM: CT CERVICAL SPINE WITHOUT CONTRAST TECHNIQUE: Multidetector CT imaging of the cervical spine was performed without intravenous contrast. Multiplanar CT image reconstructions were also generated. COMPARISON:  None. FINDINGS: Alignment: Alignment is grossly anatomic. Skull base and vertebrae: No acute fracture. No primary bone lesion or focal pathologic process. Soft tissues and spinal canal: No prevertebral fluid or swelling. No visible canal hematoma. Extensive atherosclerosis at the carotid bifurcations. Disc levels: Prominent spondylosis at C4-5, C5-6, and C6-7, with associated facet hypertrophy. There is bony fusion across the C4-5 facet joints. There is symmetrical neural foraminal narrowing at C4-5, C5-6, and C6-7. Upper chest: Airway is patent.  Mild emphysematous changes at the lung apices. Other: Reconstructed images demonstrate no additional findings. IMPRESSION: 1. No acute cervical spine fracture. 2. Multilevel spondylosis and facet hypertrophy greatest from C4-5 through C6-7. Electronically Signed   By: Sharlet Salina M.D.   On: May 14, 2020 21:16   DG Chest Left Decubitus  Result Date: 04/15/2020 CLINICAL DATA:  Fall.  Evaluate for right pneumothorax. EXAM: CHEST - LEFT DECUBITUS COMPARISON:  04/15/2020 FINDINGS: Endotracheal tube tip remains at the level of clavicles approximately 10 cm above the carina. Nasogastric tube is seen entering the stomach. No right-sided pneumothorax or hemothorax visualized. Mild streaky opacity in the medial right lower lung shows no significant change. No new or worsening areas of pulmonary opacity are seen. Pulmonary hyperinflation again seen. Subacute or chronic right lower lateral rib fracture deformities are again seen. Subacute or chronic fracture deformity of the right lateral 5th rib is also seen. IMPRESSION: Several subacute versus chronic right rib fracture deformities. No evidence of pneumothorax or hemothorax. Stable mild infiltrate in medial right lower lung. High endotracheal tube position again seen. Electronically Signed   By: Danae Orleans M.D.   On: 04/15/2020 16:05   DG Chest Port 1 View  Result Date: 04/19/2020 CLINICAL DATA:  Endotracheal tube present EXAM: PORTABLE CHEST 1 VIEW COMPARISON:  CT chest April 17, 2020 and chest radiograph April 17, 2020 FINDINGS: Endotracheal tube with tip overlying the midthoracic trachea. Right upper chest intravenous catheter with tip overlying the superior cavoatrial junction. Nasogastric tube coursing below the diaphragm with tip obscured by collimation. The heart size and mediastinal contours are unchanged. Dense right lower lobe consolidation with patchy upper lobe airspace disease. Trace right pleural effusion. No visible pneumothorax. The visualized  skeletal structures are unchanged. IMPRESSION: 1. Stable lines and tubes as described.  No visible pneumothorax. 2. Persistent right lower lobe consolidation and patchy right upper lobe airspace disease. Electronically Signed   By: Maudry Mayhew MD   On: 04/19/2020 02:57   DG Chest Port 1 View  Result Date: 04/17/2020 CLINICAL  DATA:  Increasing oxygen demand EXAM: PORTABLE CHEST 1 VIEW COMPARISON:  04/17/2020 at 0325 hours FINDINGS: Endotracheal tube, enteric tube, and right-sided PICC line remain appropriately positioned. Stable heart size. Atherosclerotic calcification of the aortic knob. Persistent patchy right lower lobe opacity. Blunting of the bilateral costophrenic angles could reflect small bilateral pleural effusions. Multiple skin folds overlie the left hemithorax. Lung markings appear to extend to the periphery of the bilateral hemithoraces. Multiple bilateral rib fractures again seen. IMPRESSION: 1. Multiple skin folds overlying the periphery of the left hemithorax. Lung markings appear to extend to the periphery of the left hemithorax. Recommend repeat chest x-ray after patient repositioning to exclude the possibility of a small left pneumothorax. 2. Persistent patchy right lower lobe opacity concerning for pneumonia. 3. Possible small bilateral pleural effusions. 4. Stable positioning of support lines and tubes. Electronically Signed   By: Duanne Guess D.O.   On: 04/17/2020 12:38   DG Chest Port 1 View  Result Date: 04/17/2020 CLINICAL DATA:  Endotracheal tube EXAM: PORTABLE CHEST 1 VIEW COMPARISON:  04/15/2020 FINDINGS: Endotracheal tube terminates 8.4 cm above the carina. Mild patchy right lower lobe opacity, suspicious for pneumonia, possibly mildly progressive. Mild left basilar opacity, atelectasis versus pneumonia. No pleural effusion or pneumothorax. The heart is normal in size. New right subclavian venous catheter terminates at the cavoatrial junction. Enteric tube courses below  the diaphragm. IMPRESSION: Mild patchy right lower lobe opacity, suspicious for pneumonia, mildly progressive. Mild left basilar opacity, atelectasis versus pneumonia. Endotracheal tube terminates 8.4 cm above the carina. New right subclavian venous catheter terminates at the cavoatrial junction. No pneumothorax. Electronically Signed   By: Charline Bills M.D.   On: 04/17/2020 05:53   DG Chest Port 1 View  Result Date: 04/15/2020 CLINICAL DATA:  OG tube placement and post intubation. EXAM: PORTABLE CHEST 1 VIEW COMPARISON:  April 14, 2020 FINDINGS: Interval placement of an endotracheal tube, tip in the proximal trachea approximately 6.5 cm above the carina. Gastric tube courses through the field of view into the upper abdomen. Subtle RIGHT basilar opacity has developed since the previous exam. No lobar consolidation. No sign of pleural effusion on frontal radiograph. Regional skeletal structures show no acute process. EKG leads project over the chest. IMPRESSION: 1. Interval placement of an endotracheal tube, tip in the proximal trachea approximately 6.5 cm above the carina. 2. Subtle RIGHT basilar opacity has developed since the previous exam, correlate with any risk for aspiration. 3. Lucency beneath the RIGHT hemidiaphragm not as pronounced as on the previous image. Again this finding may be related to lung projecting behind the diaphragm. See abdominal radiograph the same date for further detail. Electronically Signed   By: Donzetta Kohut M.D.   On: 04/15/2020 13:49   DG Abd Decub  Result Date: 04/15/2020 CLINICAL DATA:  Fall. Lucency of the right hemidiaphragm on prior abdomen x-ray. EXAM: ABDOMEN - 1 VIEW DECUBITUS COMPARISON:  04/15/2020. FINDINGS: Decubitus view reveals no free air. Lucency noted over the right hemidiaphragm on previous study represents overlying lung. NG tube again noted with tip over stomach. IMPRESSION: No free air. Lucency noted over the right hemidiaphragm previous study  represents overlying lung. Electronically Signed   By: Maisie Fus  Register   On: 04/15/2020 15:55   DG Abd Portable 1V  Addendum Date: 04/15/2020   ADDENDUM REPORT: 04/15/2020 14:36 ADDENDUM: These results were called by telephone at the time of interpretation on 04/15/2020 at 2:36 pm to provider [facility] , who verbally acknowledged these results.  Electronically Signed   By: Donzetta Kohut M.D.   On: 04/15/2020 14:36   Result Date: 04/15/2020 CLINICAL DATA:  OG tube placement and intubation. EXAM: PORTABLE ABDOMEN - 1 VIEW COMPARISON:  Previous chest x-rays dated April 14, 2020 FINDINGS: Imaging of the lower chest and upper abdomen shows a gastric tube in place, tip in the mid stomach, side port below the EG junction. Visualized lungs are clear.  EKG leads project over the chest. There is some lucency in the RIGHT upper quadrant over the liver. Visualized bowel gas pattern nonspecific with gas-filled colonic loops in the LEFT upper quadrant. Regional skeletal structures without sign of acute process. IMPRESSION: 1. The gastric tube is in place with tip in the mid stomach, side port below the EG junction. 2. Lucency in the RIGHT upper quadrant over the liver. This could be related to lung hyperinflation projecting posterior to the RIGHT hemidiaphragm. Would however suggest correlation with decubitus radiograph to exclude the possibility of pneumoperitoneum. 3. A call is out to the referring provider to further discuss findings in the above case. Electronically Signed: By: Donzetta Kohut M.D. On: 04/15/2020 13:45   ECHOCARDIOGRAM COMPLETE  Result Date: 04/11/2020    ECHOCARDIOGRAM REPORT   Patient Name:   KEIANDRE CYGAN Date of Exam: 03/25/2020 Medical Rec #:  [id number]  Height:       71.0 in Accession #:    [id number] Weight:       138.9 lb Date of Birth:  [birth year]  BSA:          1.806 m Patient Age:    75 years   BP:           141/55 mmHg Patient Gender: M          HR:           90 bpm. Exam Location:  [facility]  Procedure: 2D Echo, Cardiac Doppler and Limited Color Doppler Indications:     I42.9 Cardiomyopathy-unspecified  History:         Patient has no prior history of Echocardiogram examinations.                  Stroke. Alcohol abuse.  Sonographer:     Humphrey Rolls RDCS (AE) Referring Phys:  [id number] BRITTON L [place] Diagnosing Phys: Lorine Bears MD  Sonographer Comments: No parasternal window, no apical window and echo performed with patient supine and on artificial respirator. IMPRESSIONS  1. Left ventricular ejection fraction, by estimation, is 55 to 60%. The left ventricle has normal function. The left ventricle has no regional wall motion abnormalities. Left ventricular diastolic parameters were normal.  2. Right ventricular systolic function is normal. The right ventricular size is normal. Tricuspid regurgitation signal is inadequate for assessing PA pressure.  3. The mitral valve is normal in structure. No evidence of mitral valve regurgitation. No evidence of mitral stenosis.  4. The aortic valve is normal in structure. Aortic valve regurgitation is not visualized. Mild to moderate aortic valve sclerosis/calcification is present, without any evidence of aortic stenosis.  5. The inferior vena cava is dilated in size with <50% respiratory variability, suggesting right atrial pressure of 15 mmHg. FINDINGS  Left Ventricle: Left ventricular ejection fraction, by estimation, is 55 to 60%. The left ventricle has normal function. The left ventricle has no regional wall motion abnormalities. The left ventricular internal cavity size was normal in size. There is  no left ventricular hypertrophy. Left ventricular diastolic parameters were normal. Right Ventricle: The right ventricular  size is normal. No increase in right ventricular wall thickness. Right ventricular systolic function is normal. Tricuspid regurgitation signal is inadequate for assessing PA pressure. Left Atrium: Left atrial size was normal in size.  Right Atrium: Right atrial size was normal in size. Pericardium: There is no evidence of pericardial effusion. Mitral Valve: The mitral valve is normal in structure. No evidence of mitral valve regurgitation. No evidence of mitral valve stenosis. MV peak gradient, 4.2 mmHg. The mean mitral valve gradient is 2.0 mmHg. Tricuspid Valve: The tricuspid valve is normal in structure. Tricuspid valve regurgitation is trivial. No evidence of tricuspid stenosis. Aortic Valve: The aortic valve is normal in structure. Aortic valve regurgitation is not visualized. Mild to moderate aortic valve sclerosis/calcification is present, without any evidence of aortic stenosis. Aortic valve mean gradient measures 6.0 mmHg. Aortic valve peak gradient measures 12.8 mmHg. Pulmonic Valve: The pulmonic valve was normal in structure. Pulmonic valve regurgitation is not visualized. No evidence of pulmonic stenosis. Aorta: The aortic root is normal in size and structure. Venous: The inferior vena cava is dilated in size with less than 50% respiratory variability, suggesting right atrial pressure of 15 mmHg. IAS/Shunts: No atrial level shunt detected by color flow Doppler.   Diastology LV e' medial:    8.16 cm/s LV E/e' medial:  10.9 LV e' lateral:   10.80 cm/s LV E/e' lateral: 8.2  RIGHT VENTRICLE RV Basal diam:  4.00 cm LEFT ATRIUM           Index       RIGHT ATRIUM           Index LA Vol (A4C): 26.5 ml 14.67 ml/m RA Area:     11.30 cm                                   RA Volume:   24.60 ml  13.62 ml/m  AORTIC VALVE                    PULMONIC VALVE AV Vmax:           179.00 cm/s  PV Vmax:       1.18 m/s AV Vmean:          112.000 cm/s PV Vmean:      87.400 cm/s AV VTI:            0.302 m      PV VTI:        0.245 m AV Peak Grad:      12.8 mmHg    PV Peak grad:  5.6 mmHg AV Mean Grad:      6.0 mmHg     PV Mean grad:  3.0 mmHg LVOT Vmax:         84.20 cm/s LVOT Vmean:        58.300 cm/s LVOT VTI:          0.159 m LVOT/AV VTI ratio: 0.53  MITRAL VALVE MV Area (PHT): 5.54 cm    SHUNTS MV Peak grad:  4.2 mmHg    Systemic VTI: 0.16 m MV Mean grad:  2.0 mmHg MV Vmax:       1.02 m/s MV Vmean:      68.8 cm/s MV Decel Time: 137 msec MV E velocity: 88.70 cm/s MV A velocity: 73.30 cm/s MV E/A ratio:  1.21 Lorine Bears MD Electronically signed by Lorine Bears MD Signature Date/Time: May 05, 2020/4:10:56 PM  Final    US EKG SITE RITE  Result Date: 04/15/2020 If Site Rite image not attached, placement could not be confirmed due to current cardiac rhythm.   Microbiology Recent Results (from the past 240 hour(s))  Resp Panel by RT-PCR (Flu A&B, Covid) Nasopharyngeal Swab     Status: None   Collection Time: 04/20/2020 10:22 PM   Specimen: Nasopharyngeal Swab; Nasopharyngeal(NP) swabs in vial transport medium  Result Value Ref Range Status   SARS Coronavirus 2 by RT PCR NEGATIVE NEGATIVE Final    Comment: (NOTE) SARS-CoV-2 target nucleic acids are NOT DETECTED.  The SARS-CoV-2 RNA is generally detectable in upper respiratory specimens during the acute phase of infection. The lowest concentration of SARS-CoV-2 viral copies this assay can detect is 138 copies/mL. A negative result does not preclude SARS-Cov-2 infection and should not be used as the sole basis for treatment or other patient management decisions. A negative result may occur with  improper specimen collection/handling, submission of specimen other than nasopharyngeal swab, presence of viral mutation(s) within the areas targeted by this assay, and inadequate number of viral copies(<138 copies/mL). A negative result must be combined with clinical observations, patient history, and epidemiological information. The expected result is Negative.  Fact Sheet for Patients:  BloggerCourse.comhttps://www.fda.gov/media/152166/download  Fact Sheet for Healthcare Providers:  SeriousBroker.ithttps://www.fda.gov/media/152162/download  This test is no t yet approved or cleared by the [place] FDA and  has  been authorized for detection and/or diagnosis of SARS-CoV-2 by FDA under an Emergency Use Authorization (EUA). This EUA will remain  in effect (meaning this test can be used) for the duration of the COVID-19 declaration under Section 564(b)(1) of the Act, 21 U.S.C.section 360bbb-3(b)(1), unless the authorization is terminated  or revoked sooner.       Influenza A by PCR NEGATIVE NEGATIVE Final   Influenza B by PCR NEGATIVE NEGATIVE Final    Comment: (NOTE) The Xpert Xpress SARS-CoV-2/FLU/RSV plus assay is intended as an aid in the diagnosis of influenza from Nasopharyngeal swab specimens and should not be used as a sole basis for treatment. Nasal washings and aspirates are unacceptable for Xpert Xpress SARS-CoV-2/FLU/RSV testing.  Fact Sheet for Patients: BloggerCourse.comhttps://www.fda.gov/media/152166/download  Fact Sheet for Healthcare Providers: SeriousBroker.ithttps://www.fda.gov/media/152162/download  This test is not yet approved or cleared by the [place] FDA and has been authorized for detection and/or diagnosis of SARS-CoV-2 by FDA under an Emergency Use Authorization (EUA). This EUA will remain in effect (meaning this test can be used) for the duration of the COVID-19 declaration under Section 564(b)(1) of the Act, 21 U.S.C. section 360bbb-3(b)(1), unless the authorization is terminated or revoked.  Performed at [facility], [address] [place], [place] [address]   MRSA PCR Screening     Status: Abnormal   Collection Time: 04/02/2020 11:35 PM   Specimen: Nasal Mucosa; Nasopharyngeal  Result Value Ref Range Status   MRSA by PCR POSITIVE (A) NEGATIVE Final    Comment:        The GeneXpert MRSA Assay (FDA approved for NASAL specimens only), is one component of a comprehensive MRSA colonization surveillance program. It is not intended to diagnose MRSA infection nor to guide or monitor treatment for MRSA infections. RESULT CALLED TO, READ BACK BY AND VERIFIED WITH: CINTHIA  VEZQUEZ @0400  04/15/20 RH Performed at [facility], [address] [place], [place] [address]   Culture, Respiratory w Gram Stain     Status: None   Collection Time: 04/18/20 12:38 AM   Specimen: Tracheal Aspirate;  Respiratory  Result Value Ref Range Status   Specimen Description   Final    TRACHEAL ASPIRATE Performed at [facility], [address] [place], [place] [address]    Special Requests   Final    NONE Performed at [facility], [address] [place], [place] [address]    Gram Stain   Final    ABUNDANT WBC PRESENT, PREDOMINANTLY PMN NO SQUAMOUS EPITHELIAL CELLS SEEN ABUNDANT GRAM NEGATIVE RODS FEW GRAM POSITIVE COCCI IN PAIRS Performed at [facility], [address], [place], [place] [address]    Culture ABUNDANT ESCHERICHIA COLI  Final   Report Status 04/20/2020 FINAL  Final   Organism ID, Bacteria ESCHERICHIA COLI  Final      Susceptibility   Escherichia coli - MIC*    AMPICILLIN <=2 SENSITIVE Sensitive     CEFAZOLIN <=4 SENSITIVE Sensitive     CEFEPIME <=0.12 SENSITIVE Sensitive     CEFTAZIDIME <=1 SENSITIVE Sensitive     CEFTRIAXONE <=0.25 SENSITIVE Sensitive     CIPROFLOXACIN <=0.25 SENSITIVE Sensitive     GENTAMICIN <=1 SENSITIVE Sensitive     IMIPENEM <=0.25 SENSITIVE Sensitive     TRIMETH/SULFA <=20 SENSITIVE Sensitive     AMPICILLIN/SULBACTAM <=2 SENSITIVE Sensitive     PIP/TAZO <=4 SENSITIVE Sensitive     * ABUNDANT ESCHERICHIA COLI    Lab Basic Metabolic Panel: Recent Labs  Lab 04/19/20 0411 04/19/20 2214 04/20/20 0430 04/06/2020 0501 04/20/2020 0502 03/26/2020 1810 04/22/20 0417 05/04/2020 0436  NA 137 138 136  --  138  --  140 139  K 4.7  --  4.7  --  5.4* 5.2* 5.2* 5.0  CL 104  --  104  --  106  --  108 108  CO2 27  --  24  --  21*  --  24 24  GLUCOSE 201*  --  196*  --  162*  --  103* 114*  BUN 42*  --  64*  --  96*  --  111* 127*  CREATININE 0.87  --  1.69*  --  2.87*  --  3.32* 3.29*  CALCIUM 8.5*   --  8.9  --  8.3*  --  8.4* 8.0*  MG 1.7  --  2.1 2.3  --   --  2.4 2.3  PHOS 2.7  --  4.9*  --  5.4*  --  4.4 5.3*   Liver Function Tests: Recent Labs  Lab 04/17/20 0440 04/18/20 0441 04/07/2020 0502 04/22/20 0417 05/20/2020 0436  AST  --  24  --   --   --   ALT  --  16  --   --   --   ALKPHOS  --  48  --   --   --   BILITOT  --  0.5  --   --   --   PROT  --  5.6*  --   --   --   ALBUMIN 3.3* 2.8* 2.3* 2.1* 2.0*   No results for input(s): LIPASE, AMYLASE in the last 168 hours. No results for input(s): AMMONIA in the last 168 hours. CBC: Recent Labs  Lab 04/19/20 0411 04/20/20 0430 03/30/2020 0625 04/22/20 0417 05/11/2020 0436  WBC 16.3* 17.4* 14.9* 12.1* 11.7*  HGB 9.5* 9.1* 9.1* 8.4* 8.4*  HCT 28.4* 28.8* 29.1* 26.8* 26.0*  MCV 99.3 104.0* 104.3* 103.9* 101.2*  PLT 192 215 212 200 176   Cardiac Enzymes: No results for input(s): CKTOTAL, CKMB,  CKMBINDEX, TROPONINI in the last 168 hours. Sepsis Labs: Recent Labs  Lab 04/17/20 1145 04/18/20 0441 04/19/20 0411 04/20/20 0430 05-19-2020 0625 04/22/20 0417 05/19/2020 0436  PROCALCITON 0.33 0.53 0.50  --   --   --   --   WBC  --  9.8 16.3* 17.4* 14.9* 12.1* 11.7*     Andreya Lacks 05/04/2020, 2:09 PM

## 2020-05-23 NOTE — Progress Notes (Signed)
1351, Asystole on the monitor. No heart or lung sounds. Pupils are dilated and fixed. 2 RNs  pronounced death. MD notified and aware

## 2020-05-23 DEATH — deceased

## 2023-03-24 IMAGING — DX DG CHEST 1V PORT
1 series · 2 of 2 positions shown · non-contrast
Comparison: April 14, 2020

CLINICAL DATA: OG tube placement and post intubation.

EXAM:
PORTABLE CHEST 1 VIEW

[Series 1: chest ap · 0.14mm/px · 2 of 2 slices shown]
[im 1/2]
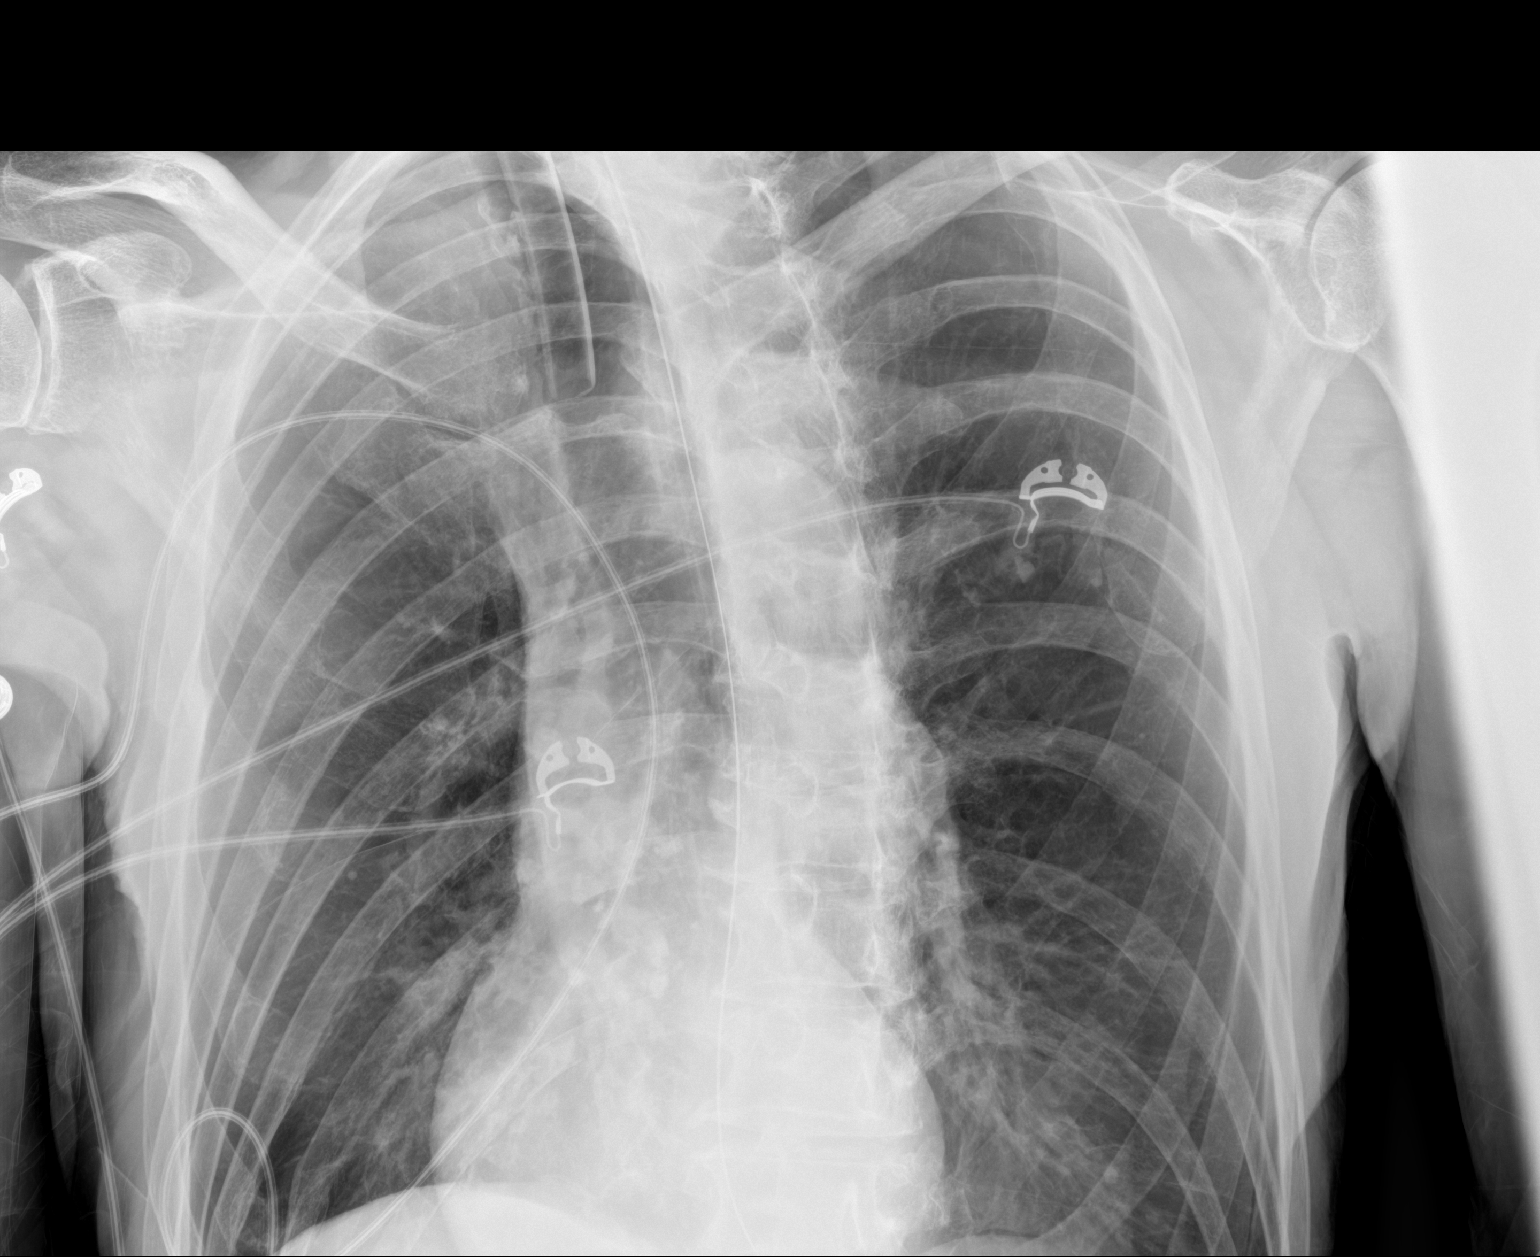
[im 2/2]
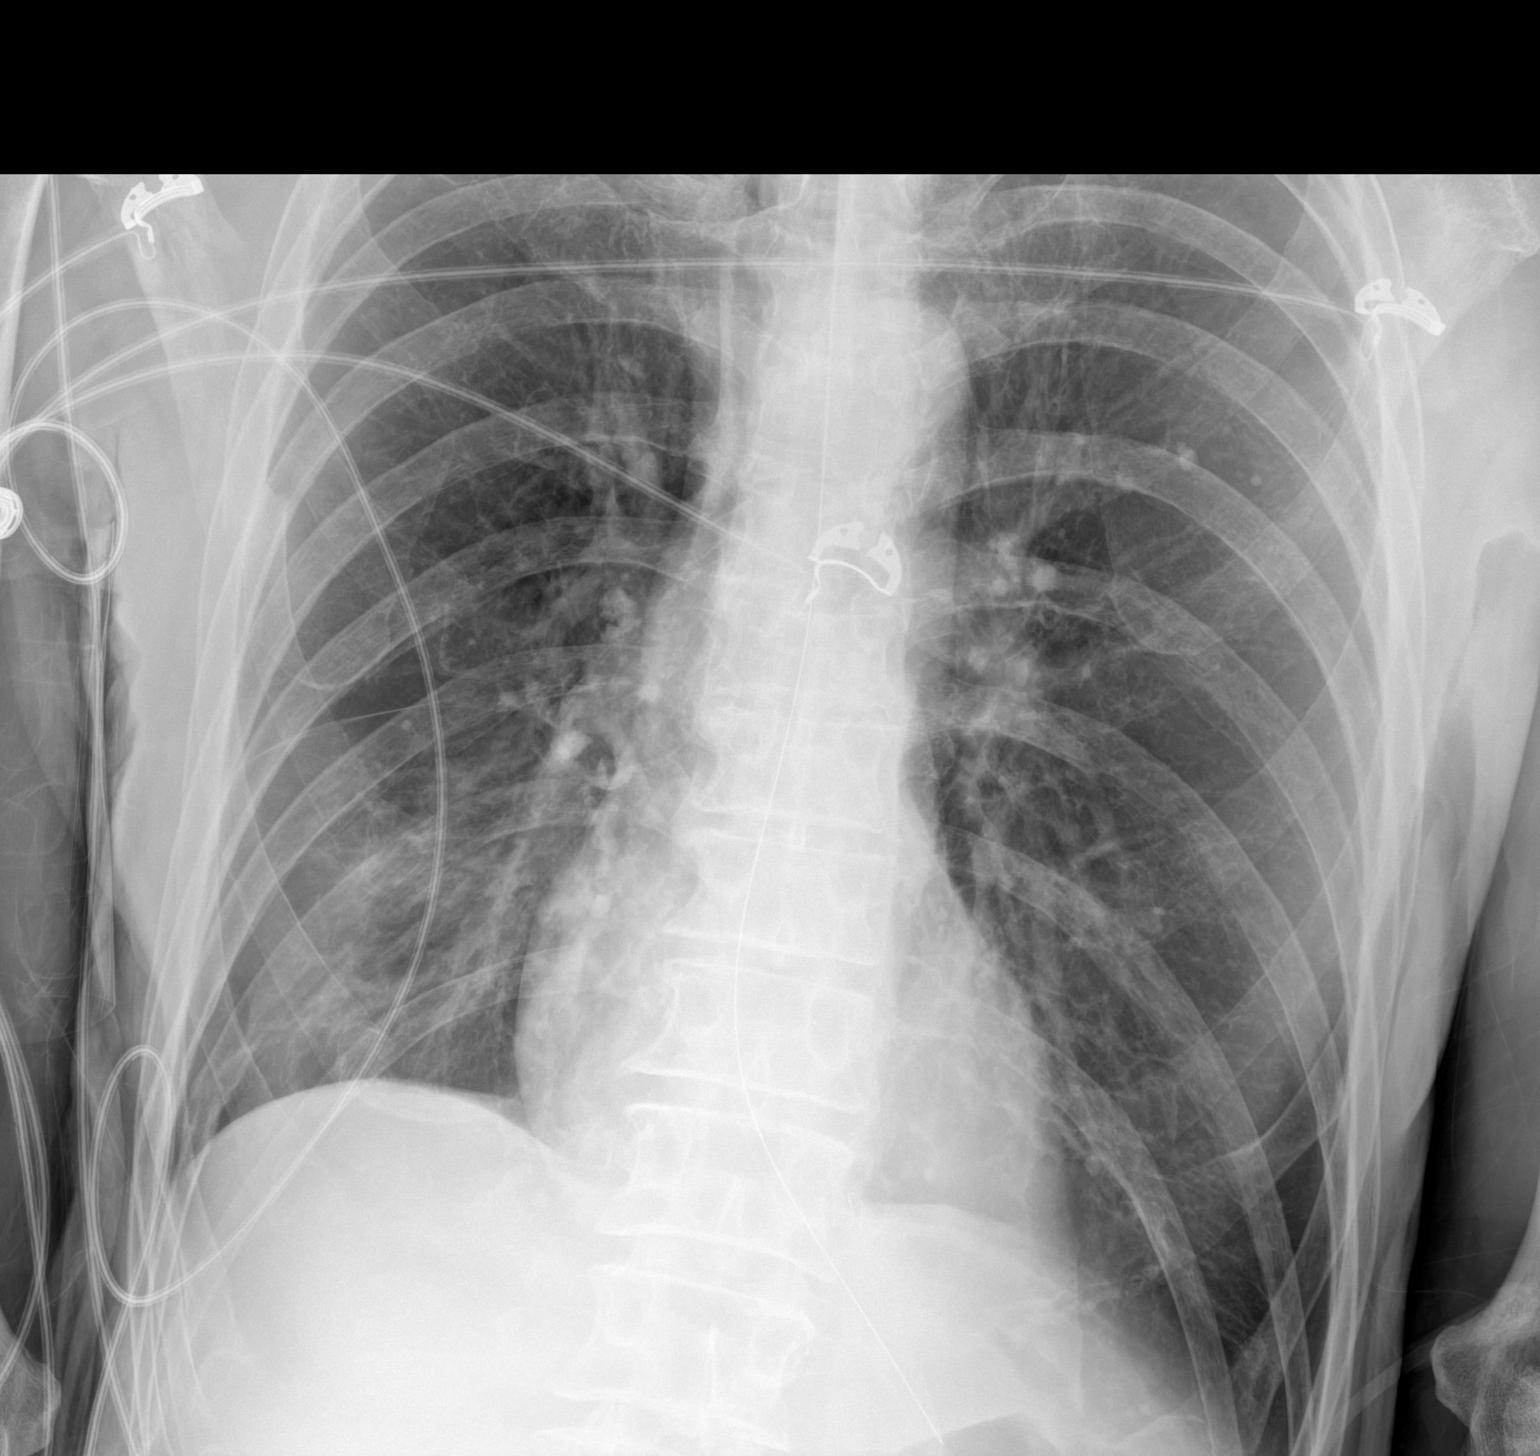

[2 of 2 positions shown; findings below may reference images not displayed]

FINDINGS: Interval placement of an endotracheal tube, tip in the proximal
trachea approximately 6.5 cm above the carina.

Gastric tube courses through the field of view into the upper
abdomen.

Subtle RIGHT basilar opacity has developed since the previous exam.
No lobar consolidation. No sign of pleural effusion on frontal
radiograph.

Regional skeletal structures show no acute process. EKG leads
project over the chest.
IMPRESSION: 1. Interval placement of an endotracheal tube, tip in the proximal
trachea approximately 6.5 cm above the carina.
2. Subtle RIGHT basilar opacity has developed since the previous
exam, correlate with any risk for aspiration.
3. Lucency beneath the RIGHT hemidiaphragm not as pronounced as on
the previous image. Again this finding may be related to lung
projecting behind the diaphragm. See abdominal radiograph the same
date for further detail.

## 2023-03-28 IMAGING — DX DG CHEST 1V PORT
1 series · 3 of 3 positions shown · non-contrast
Comparison: CT chest April 17, 2020 and chest radiograph April 17, 2020

CLINICAL DATA: Endotracheal tube present

EXAM:
PORTABLE CHEST 1 VIEW

[Series 1: chest ap · 0.14mm/px · 3 of 3 slices shown]
[im 1/3]
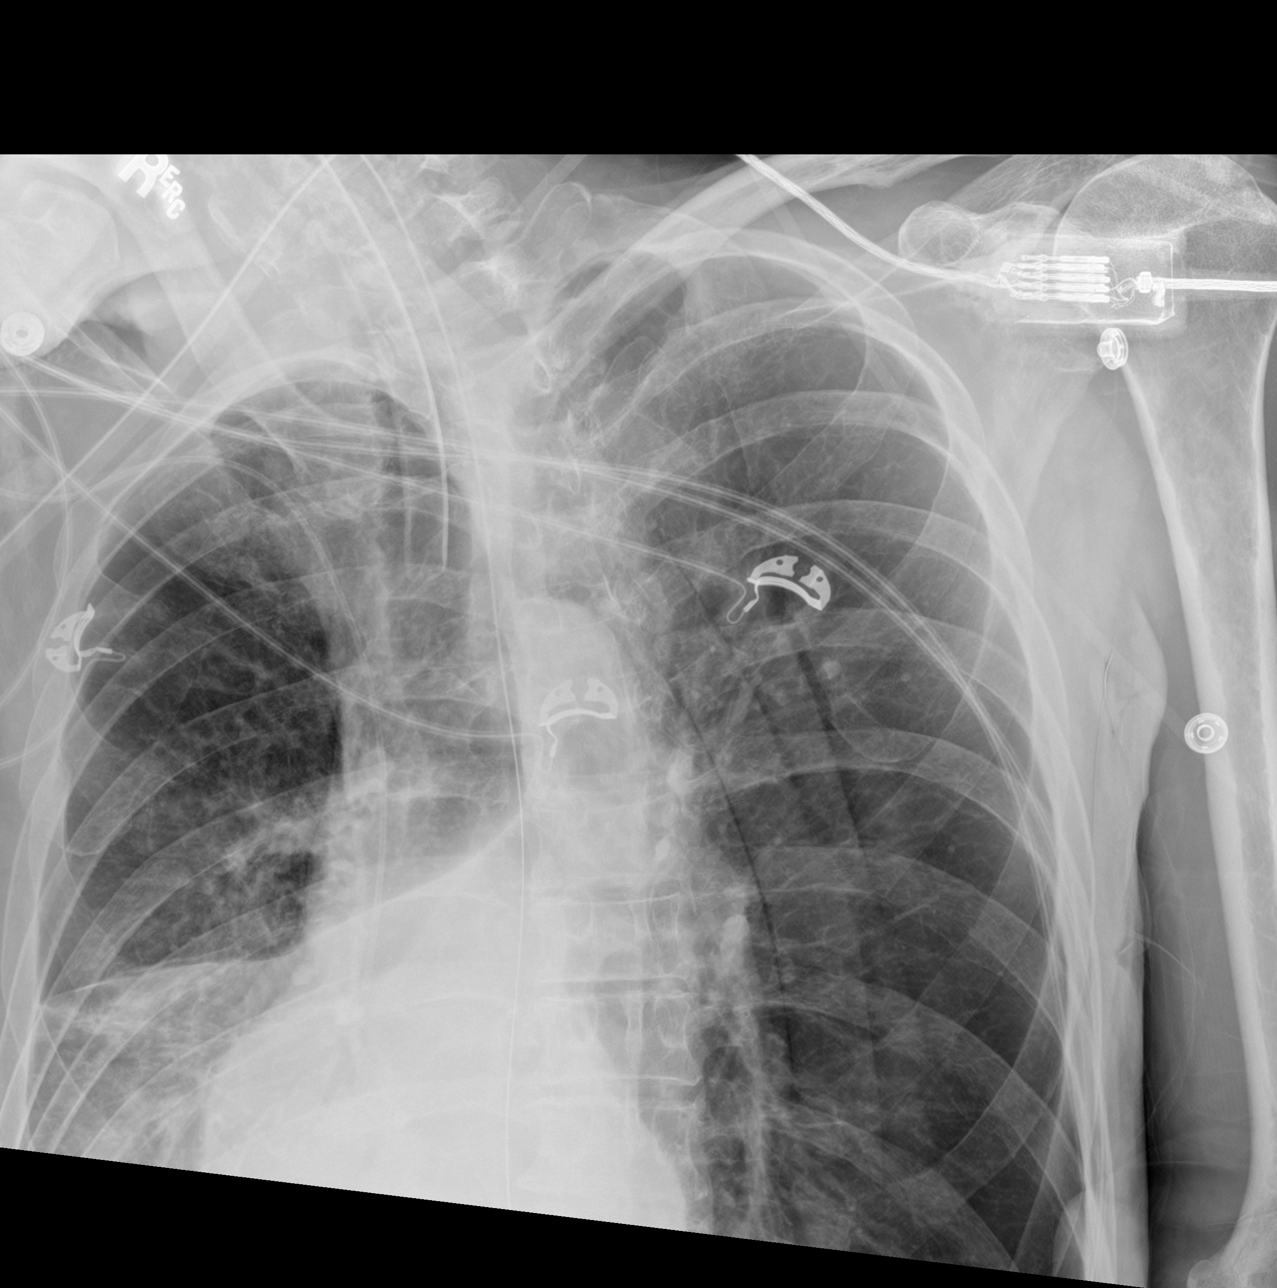
[im 2/3]
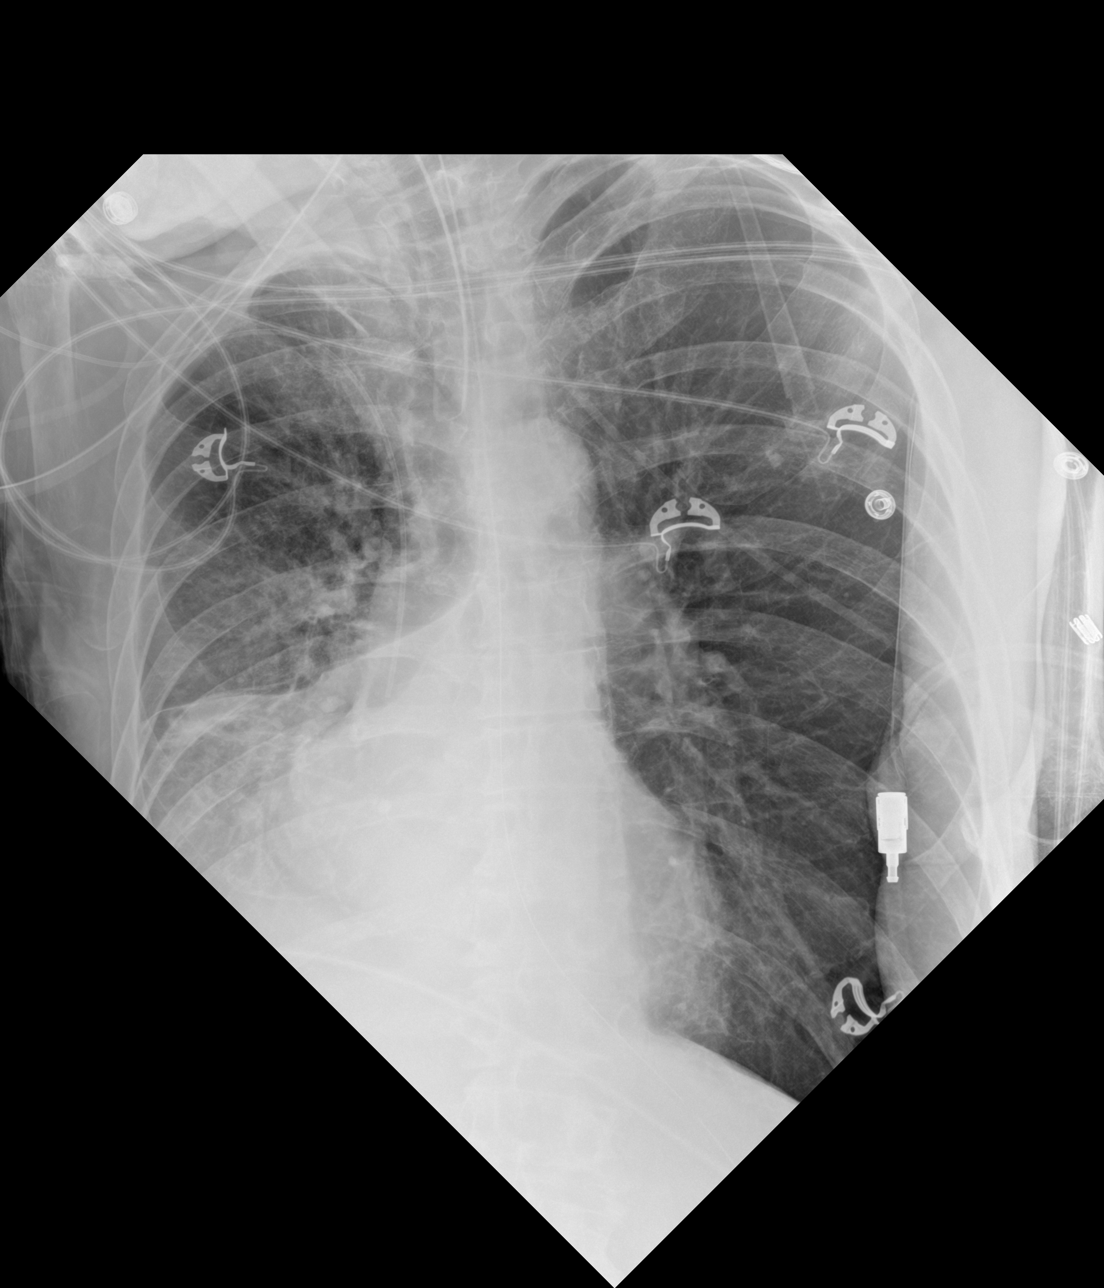
[im 3/3]
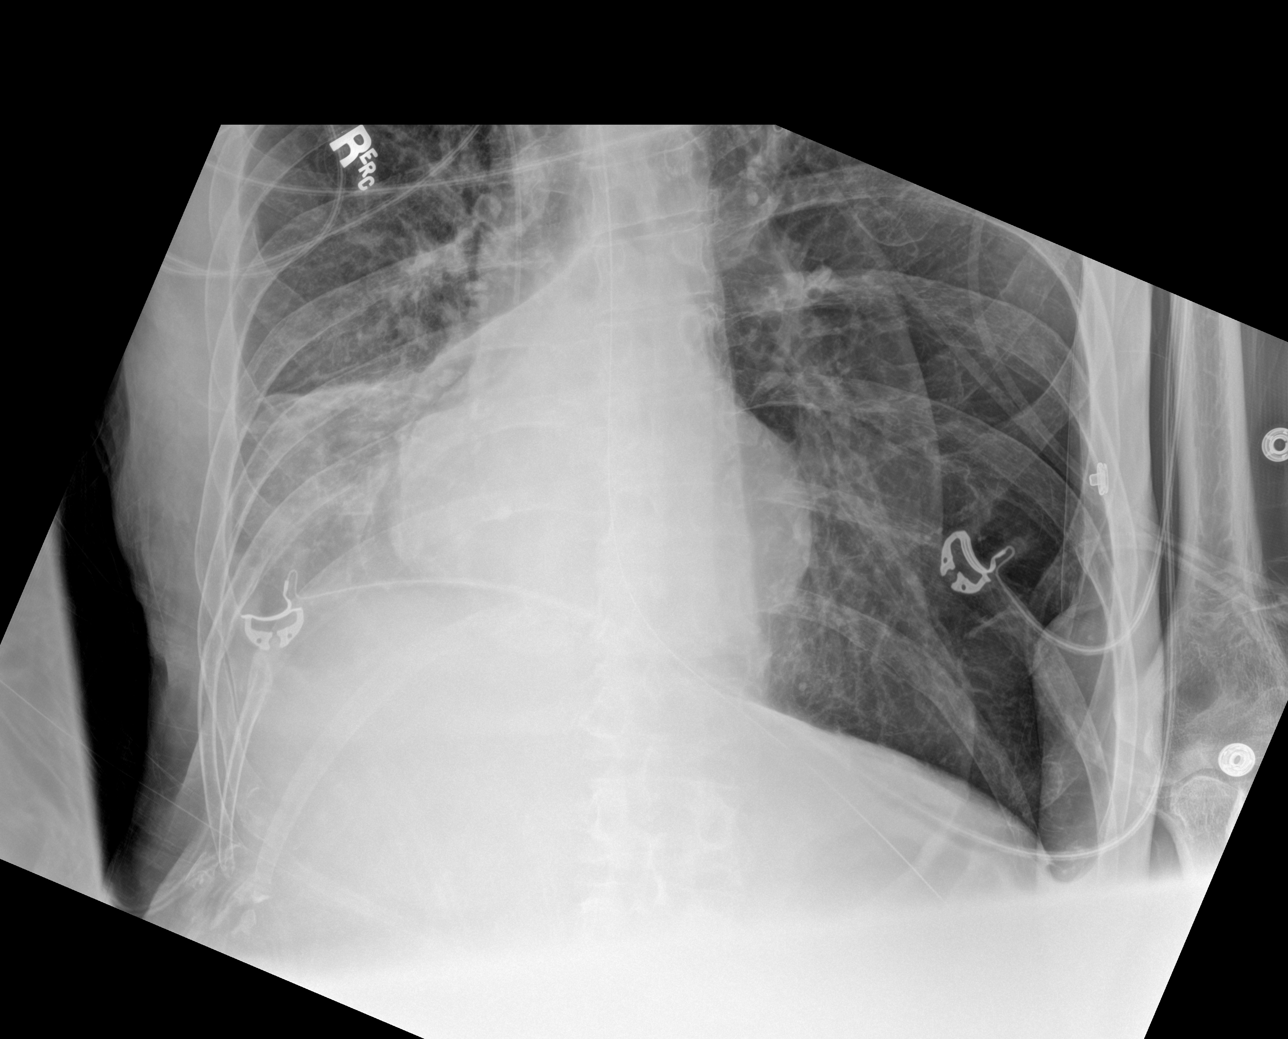

[3 of 3 positions shown; findings below may reference images not displayed]

FINDINGS: Endotracheal tube with tip overlying the midthoracic trachea. Right
upper chest intravenous catheter with tip overlying the superior
cavoatrial junction. Nasogastric tube coursing below the diaphragm
with tip obscured by collimation. The heart size and mediastinal
contours are unchanged. Dense right lower lobe consolidation with
patchy upper lobe airspace disease. Trace right pleural effusion. No
visible pneumothorax. The visualized skeletal structures are
unchanged.
IMPRESSION: 1. Stable lines and tubes as described.  No visible pneumothorax.
2. Persistent right lower lobe consolidation and patchy right upper
lobe airspace disease.
